# Patient Record
Sex: Male | Born: 1960 | Race: White | Hispanic: No | Marital: Married | Smoking: Never smoker
Health system: Southern US, Community
[De-identification: ages and names within clinical notes are randomized; demographics above are authoritative.]

## PROBLEM LIST (undated history)

## (undated) DIAGNOSIS — M1990 Unspecified osteoarthritis, unspecified site: Secondary | ICD-10-CM

## (undated) DIAGNOSIS — M1712 Unilateral primary osteoarthritis, left knee: Secondary | ICD-10-CM

## (undated) DIAGNOSIS — K219 Gastro-esophageal reflux disease without esophagitis: Secondary | ICD-10-CM

## (undated) DIAGNOSIS — Z87442 Personal history of urinary calculi: Secondary | ICD-10-CM

## (undated) DIAGNOSIS — M109 Gout, unspecified: Secondary | ICD-10-CM

## (undated) DIAGNOSIS — F329 Major depressive disorder, single episode, unspecified: Secondary | ICD-10-CM

## (undated) DIAGNOSIS — F32A Depression, unspecified: Secondary | ICD-10-CM

## (undated) DIAGNOSIS — M19012 Primary osteoarthritis, left shoulder: Secondary | ICD-10-CM

## (undated) HISTORY — PX: KNEE ARTHROSCOPY: SUR90

## (undated) HISTORY — PX: EYE SURGERY: SHX253

## (undated) HISTORY — PX: JOINT REPLACEMENT: SHX530

## (undated) HISTORY — PX: SHOULDER SURGERY: SHX246

## (undated) HISTORY — PX: BACK SURGERY: SHX140

## (undated) HISTORY — PX: HAND RECONSTRUCTION: SHX1730

---

## 1998-03-29 ENCOUNTER — Emergency Department (HOSPITAL_COMMUNITY): Admission: EM | Admit: 1998-03-29 | Discharge: 1998-03-29 | Payer: Self-pay | Admitting: Emergency Medicine

## 1998-04-29 ENCOUNTER — Ambulatory Visit (HOSPITAL_COMMUNITY): Admission: RE | Admit: 1998-04-29 | Discharge: 1998-04-29 | Payer: Self-pay | Admitting: Urology

## 1998-09-28 HISTORY — PX: NOSE SURGERY: SHX723

## 1999-07-15 ENCOUNTER — Encounter: Payer: Self-pay | Admitting: Emergency Medicine

## 1999-07-15 ENCOUNTER — Emergency Department (HOSPITAL_COMMUNITY): Admission: EM | Admit: 1999-07-15 | Discharge: 1999-07-15 | Payer: Self-pay | Admitting: Emergency Medicine

## 1999-08-13 ENCOUNTER — Encounter: Payer: Self-pay | Admitting: Specialist

## 1999-08-13 ENCOUNTER — Encounter: Admission: RE | Admit: 1999-08-13 | Discharge: 1999-08-13 | Payer: Self-pay | Admitting: Specialist

## 2000-10-06 ENCOUNTER — Emergency Department (HOSPITAL_COMMUNITY): Admission: EM | Admit: 2000-10-06 | Discharge: 2000-10-06 | Payer: Self-pay | Admitting: Emergency Medicine

## 2001-02-17 ENCOUNTER — Emergency Department (HOSPITAL_COMMUNITY): Admission: EM | Admit: 2001-02-17 | Discharge: 2001-02-17 | Payer: Self-pay | Admitting: Internal Medicine

## 2001-02-17 ENCOUNTER — Encounter: Payer: Self-pay | Admitting: Emergency Medicine

## 2001-05-13 ENCOUNTER — Encounter: Payer: Self-pay | Admitting: General Surgery

## 2001-05-13 ENCOUNTER — Ambulatory Visit (HOSPITAL_COMMUNITY): Admission: RE | Admit: 2001-05-13 | Discharge: 2001-05-13 | Payer: Self-pay | Admitting: General Surgery

## 2001-06-13 ENCOUNTER — Encounter (INDEPENDENT_AMBULATORY_CARE_PROVIDER_SITE_OTHER): Payer: Self-pay | Admitting: Specialist

## 2001-06-13 ENCOUNTER — Ambulatory Visit (HOSPITAL_COMMUNITY): Admission: RE | Admit: 2001-06-13 | Discharge: 2001-06-13 | Payer: Self-pay | Admitting: General Surgery

## 2001-09-10 ENCOUNTER — Emergency Department (HOSPITAL_COMMUNITY): Admission: EM | Admit: 2001-09-10 | Discharge: 2001-09-10 | Payer: Self-pay

## 2004-01-10 ENCOUNTER — Emergency Department (HOSPITAL_COMMUNITY): Admission: EM | Admit: 2004-01-10 | Discharge: 2004-01-10 | Payer: Self-pay | Admitting: Emergency Medicine

## 2006-02-25 ENCOUNTER — Emergency Department (HOSPITAL_COMMUNITY): Admission: EM | Admit: 2006-02-25 | Discharge: 2006-02-25 | Payer: Self-pay | Admitting: Emergency Medicine

## 2007-04-15 ENCOUNTER — Encounter: Admission: RE | Admit: 2007-04-15 | Discharge: 2007-04-15 | Payer: Self-pay | Admitting: Gastroenterology

## 2007-09-29 HISTORY — PX: SPINAL CORD STIMULATOR IMPLANT: SHX2422

## 2008-04-30 ENCOUNTER — Encounter
Admission: RE | Admit: 2008-04-30 | Discharge: 2008-04-30 | Payer: Self-pay | Admitting: Physical Medicine and Rehabilitation

## 2008-05-30 ENCOUNTER — Ambulatory Visit (HOSPITAL_COMMUNITY): Admission: RE | Admit: 2008-05-30 | Discharge: 2008-05-31 | Payer: Self-pay | Admitting: Specialist

## 2009-03-07 ENCOUNTER — Ambulatory Visit (HOSPITAL_COMMUNITY): Admission: RE | Admit: 2009-03-07 | Discharge: 2009-03-08 | Payer: Self-pay | Admitting: Orthopedic Surgery

## 2009-04-30 ENCOUNTER — Emergency Department (HOSPITAL_COMMUNITY): Admission: EM | Admit: 2009-04-30 | Discharge: 2009-04-30 | Payer: Self-pay | Admitting: Emergency Medicine

## 2010-06-05 ENCOUNTER — Encounter: Payer: Self-pay | Admitting: Sports Medicine

## 2010-06-13 ENCOUNTER — Encounter: Payer: Self-pay | Admitting: Sports Medicine

## 2010-06-23 ENCOUNTER — Ambulatory Visit: Payer: Self-pay | Admitting: Sports Medicine

## 2010-06-23 ENCOUNTER — Encounter: Admission: RE | Admit: 2010-06-23 | Discharge: 2010-06-23 | Payer: Self-pay | Admitting: Occupational Medicine

## 2010-06-26 ENCOUNTER — Encounter: Payer: Self-pay | Admitting: Sports Medicine

## 2010-10-28 NOTE — Letter (Signed)
Summary: [employer]   Imported By: Marily Memos 06/23/2010 11:22:14  _____________________________________________________________________  External Attachment:    Type:   Image     Comment:   External Document

## 2010-10-28 NOTE — Letter (Signed)
Summary: [facility]  [facility]I   Imported By: Marily Memos 06/26/2010 10:31:43  _____________________________________________________________________  External Attachment:    Type:   Image     Comment:   External Document

## 2010-10-28 NOTE — Consult Note (Signed)
Summary: [employer] [place] [facility] [place]   Imported By: Marily Memos 06/06/2010 08:53:55  _____________________________________________________________________  External Attachment:    Type:   Image     Comment:   External Document
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
## 2010-10-28 NOTE — Letter (Signed)
Summary: [employer], [employer]   Imported By: Marily Memos 06/13/2010 11:49:01  _____________________________________________________________________  External Attachment:    Type:   Image     Comment:   External Document

## 2010-10-28 NOTE — Letter (Signed)
Summary: *Consult Note  [facility]  [address]   [place], [place] [address]   Phone: [phone]  Fax: [phone]    Re:    Alan Kennedy DOB:    [birth year] Kathrine Haddock, MD [facility] 06/23/10   Dear Grayland Ormond:    Thank you for requesting that we see the above patient for consultation.  A copy of the detailed office note will be sent under separate cover, for your review.  Evaluation today is consistent with:  1)  MEDIAL MENISCUS TEAR, LEFT (ICD-836.0) 2)  KNEE INJURY, LEFT (ICD-959.7)   Our recommendation is for: surgical consultation as I think this injury is causing mechanical symptoms.  Still very painful at iimes as well.   New Orders include:  1)  Diagnostic X-Ray/Fluoroscopy [Diagnostic X-Ray/Flu] 2)  Consultation Level II [99242] 3)  [place] LIMITED [[id number]]    Thank you for this consultation.  If you have any further questions regarding the care of this patient, please do not hesitate to contact me @ [phone].  Thank you for this opportunity to look after your patient.  Sincerely,  Vincent Gros MD

## 2011-01-05 LAB — HEMOGLOBIN AND HEMATOCRIT, BLOOD
HCT: 42.1 % (ref 39.0–52.0)
Hemoglobin: 14.3 g/dL (ref 13.0–17.0)

## 2011-02-10 NOTE — Op Note (Signed)
NAME:  Alan Kennedy, Alan Kennedy NO.:  [id number]   MEDICAL RECORD NO.:  [id number]          PATIENT TYPE:  AMB   LOCATION:  DAY                          FACILITY[facility]  [facility]   PHYSICIAN:  Alvy Beal, MD    DATE OF BIRTH:  Feb 22, 1961   DATE OF PROCEDURE:  03/07/2009  DATE OF DISCHARGE:                               OPERATIVE REPORT   PREOPERATIVE DIAGNOSIS:  Chronic back and right lower extremity pain.   POSTOPERATIVE DIAGNOSIS:  Chronic back and right lower extremity pain.   OPERATIVE PROCEDURE:  Implantation of permanent spinal cord stimulator.   COMPLICATIONS:  None.   FIRST ASSISTANT:  Crissie Reese.   Minimal blood loss.   HISTORY:  This is a very pleasant 50 year old gentleman who in the past  had lumbar decompression diskectomy.  He continued to have significant  back and leg pain.  A trial stimulator was placed, and the patient noted  significant relief in his symptoms.  As a result, he elected to proceed  with permanent implantation.   All appropriate risks, benefits and alternatives to surgery were  discussed, and the patient consented to the aforementioned procedure.   OPERATIVE NOTE:  The patient was brought to the operating theater,  placed supine on the operating table.  After successful induction of  general anesthesia, endotracheal intubation, TED, SCDs were applied.  He  was turned prone onto a Wilson frame and the arms placed overhead.  All  bony prominences were well padded, and the back was prepped and draped  in standard fashion.  Preoperatively, in the holding area in the sitting  and standing position, I measured and marked where the right gluteal  incision would be for the battery, and the patient was satisfied with  this.  The prep and drape including areas so I could visualize both  sides.   An x-ray was then brought to the wound sterilely, and then lateral  fluoroscopy we began counting from L5 up to T9 spinous process.  Once  identified the T9 spinous process, I then planned to do my incision  centered about the T9-10 disk space.  Preoperatively, the trial was  placed and most utilized between T7 and C8-1.   A midline incision was made approximately 3.5 inches in length.  Sharp  dissection was carried out down to and through the deep fascia.  Using a  Cobb elevator and electrocautery, I mobilized the paraspinal muscles to  expose the spinous process and lamina of T9-T10.   I then marked the T9-T10 interspinous process space and then counted  again from L5 using lateral fluoroscopy.  I used 18-gauge spinal needles  to mark the levels as I counted up.  Again, once I identified that I was  at the appropriate T9 lamina, I began my decompression.   Using a double-action Leksell rongeur, I removed the inferior portion of  the T9 spinous process.  I then used a micro curette to develop a plane  underneath the lamina and then used the 2- and 3-mm Kerrison to perform  a generous laminotomy.  Once I had the laminotomy complete,  I then used  a Penfield 4 to develop a plane underneath the ligamentum flavum.  I  used a 2-mm Kerrison to resect this ligamentum flavum and exposed the  underlying thecal sac.  Once I had the wide laminotomy, I could clearly  see the thecal sac.  I then used a dural elevator trial the area to  ensure that I would be able to freely pass the implant.  Once I was able  to do this freely without tension on the thecal sac without significant  compression or attention to the thecal sac, I then obtained the standard  10-mm wide St. Jude dorsal column stimulator and advanced it just to the  inferior aspect of the T7 superior endplate.  This allowed the implant  to span from T7 to T8.  I checked the AP films, and the implant was  slightly over to the right which was the symptomatic side.  I then again  counted from L5 up to ensure that my implant was properly positioned.  Once I had confirmed this, I  then began suture down to prevent  migration.  I placed 2 bone holes into the spinous process of T10 and  then used a #2 fiber wire to secure the leads to the bone to the T10  spinous process.  I then made a cut in the T10-11 interspinous process  ligament to allow me to wrap the leads were around this.  I then sutured  the ligament back together to hold the leads in place.   At this point in time, I then made another incision over the right  gluteal region and sharply dissected down 2.5 cm.  I then bluntly made a  pocket for the battery.  Once this was completed, I used the submuscular  passer to pass from the thoracic wound to about the midportion of the  thoracic back, and I punched out through a hole.  Because of the  distance, I could not do it in one pass.  I then passed the wires  through the leads and out the temporary hole near the thoracolumbar  junction and then placed another hole from the stab incision into the  right gluteal region where the battery will be located.  Once the wires  were all passed and tucked submuscular, I then connected it to the  battery and ran a test run.  The leads were functioning properly without  any problems.  At this point, having passed the leads all the way down,  I secured into the battery and then took final AP and lateral x-rays,  again confirming that the leads had not migrated during the positioning  portion of the surgery.   At this point, I then placed the battery in the pocket and tied it down  to the deep fascia with interrupted #1 Vicryl suture.  All wounds were  copiously irrigated, and thrombin-soaked Gelfoam was placed over the  laminotomy site.  I then closed the deep fascia of both wounds with  interrupted #1 Vicryl sutures, superficial 2-0 Vicryl sutures and 3-0  Monocryl for the skin.  Steri-Strips and dry dressing were applied, and  the patient was extubated and transferred to PACU without incident.  At  the end of the case,  all needle and sponge counts were correct.  There  were no adverse intraoperative complications.      Alvy Beal, MD  Electronically Signed     DDB/MEDQ  D:  03/07/2009  T:  03/08/2009  Job:  [id number]

## 2011-02-10 NOTE — Op Note (Signed)
NAMENGUYEN, TODOROV NO.:  [id number]   MEDICAL RECORD NO.:  [id number]          PATIENT TYPE:  OIB   LOCATION:  1524                         FACILITY:  [facility]   PHYSICIAN:  Jene Every, M.D.    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   PROCEDURE:  Lateral recess decompression, microdiskectomy 4-5 right,  foraminotomy of L5.   ANESTHESIA:  General.   SURGEON:  Jene Every, M.D.   ASSISTANT:  Roma Schanz, P.A.   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  He had a  positive sign, EHL weakness, numbness in the L5 nerve root distribution.  He was indicated for decompression.  Risks and benefits discussed  including bleeding, infection, damage to neurovascular structures, CSF  leakage, epidural fibrosis, adjacent segment disease, need for fusion in  the future, anesthetic complications, etc.   PROCEDURE IN DETAIL:  With the patient in supine position after  induction of adequate general anesthesia and 2 grams of Kefzol he was  placed prone on the [place] frame.  All bony prominences well-padded and  the lumbar area was prepped and draped in the usual sterile fashion.  Two 18 gauge spinal needles utilized to localize the 4-5 interspace  confirmed with x-ray.  Incision was made from the spinous process to 4-5  and subcutaneous tissue was dissected and electrocautery used to achieve  hemostasis.  The McCullough retractor was placed.  Operating microscope  draped and brought into the surgical field after x-ray confirmed the  4-5  space.  Hemilaminotomy of the caudad edge of 4 was performed with 2 mm  Kerrison.  A straight curette was utilized to detach the ligamentum  flavum from the cephalad edge of 5.  Neural patty placed beneath the  ligamentum flavum, hypertrophic ligamentum flavum and lateral recess  stenosis was noted significantly into the lateral recess multifactorial.  Ligamentum flavum then removed from the interspace.  The thecal sac was  gently mobilized medially as was the L5 nerve root with small focal HNP  with an epidural venous plexus.  Ligamentum flavum of the 5 root this  decompressed the medial border of the pedicle.  Bipolar cautery was  utilized to achieve hemostasis.  Lysed the epidural venous plexus and  mobilized it medially, performed an annulotomy and copious portion of  distal tear was removed with straight and upbiting pituitary.  After  diskectomy of all herniated material there was at least 1 cm of  excursion on the 5 root medial to the pedicle.  We examined the foramen  of 4, 5 beneath thecal sac, axilla and the shoulder of the root.  There  was no evidence of a nerve compressive lesion.  Disk  space was copiously  irrigated with antibiotic irrigation.  Final confirmatory radiograph was  obtained just prior to the diskectomy.  Next, the [facility] retractor  was removed and irrigated.  Paraspinous muscles inspected and there was  no evidence of active bleeding.  Dorsolumbar fascia reapproximated with  0 Vicryl interrupted figure-of-eight sutures, subcu with 2-0 Vicryl  simple sutures.  Skin  was reapproximated with 4-0 subcuticular Prolene.  Wound reinforced with  Steri-Strips.  Sterile dressing applied.  The patient was placed supine  on the hospital bed, extubated without difficulty and transferred to  recovery room in satisfactory condition.  The patient tolerated the  procedure without any complications.      Jene Every, M.D.  Electronically Signed     JB/MEDQ   D:  05/30/2008  T:  05/30/2008  Job:  [id number]

## 2011-02-13 NOTE — Discharge Summary (Signed)
Alan Kennedy, Alan Kennedy NO.:  [id number]   MEDICAL RECORD NO.:  [id number]          PATIENT TYPE:  OIB   LOCATION:  1615                         FACILITY:  [facility]   PHYSICIAN:  Alvy Beal, MD    DATE OF BIRTH:  [birth year]   DATE OF ADMISSION:  03/07/2009  DATE OF DISCHARGE:  03/08/2009                               DISCHARGE SUMMARY   ADMISSION DIAGNOSIS:  Chronic back and right lower extremity pain.   DISCHARGE DIAGNOSIS:  Chronic back and right lower extremity pain.   OPERATIVE PROCEDURE:  Implantation of a permanent spinal cord  stimulator.   BRIEF HISTORY:  Alan Kennedy is a very pleasant 50 year old gentleman  who in the past has had a lumbar decompression and a diskectomy.  He has  continued to have significant back and leg pain . A trial spinal cord  stimulator was placed and the patient noted significant relief of  symptoms.  As a result, he elected to proceed with his permanent  implantation.  All appropriate risks, benefits, and alternatives to  surgery were discussed and the patient consented to the procedure.   HOSPITAL COURSE:  Patient's hospital course was approximately 1 day in  length.  He tolerated his procedure very well and was transferred from  the PACU to the orthopedic floor without incident.  Postoperatively day  #1, patient had no shortness of breath and/or chest pain.  His spinal  cord stimulator was turned on and it was giving him good coverage.  His  pain was well controlled.  He was tolerating a regular diet.  He had no  shortness of breath and/or chest pain.  He worked well with physical  therapy therefore he was deemed stable to be discharged home.   DISPOSITION:  To home.   DISCHARGE CONDITION:  Stable.   DISCHARGE MEDICATIONS:  Include:  1. Fish oil.  2. Multivitamin.  3. He also being discharged home on Percocet 5/325 one tab p.o. every      4 to 6 hours p.r.n. pain.  4. Robaxin 500 mg 1 tablet p.o. q.8 p.r.n. muscle  spasms.   He is to discontinue the use of his home hydrocodone.   DISCHARGE INSTRUCTIONS:  He is to follow up with Dr. Shon Baton in 2 weeks  for a wound check for removal of sutures.  He is allowed to shower on  postoperatively day #5.  He is still to have back precautions.  No  bending, stooping, twisting, and/or squatting.  No lifting for 6 weeks.  No driving for 6 weeks.  No sexual activity for 6  weeks.  He is to change his dressing daily for 7 days.  He is to call  our office at [phone] to set up his followup appointment.  He is also  to call our office at that number if he experiences any fevers, chills,  weakness, nausea or vomiting, pain or redness around his incision site.      Crissie Reese, PA      Alvy Beal, MD  Electronically Signed    AC/MEDQ  D:  04/14/2009  T:  04/14/2009  Job:  [id number]

## 2011-02-13 NOTE — Op Note (Signed)
[facility]  Patient:    Alan Kennedy, Alan Kennedy Visit Number: [id number] MRN: [id number]          Service Type: DSU Location: DAY Attending Physician:  Carson Myrtle Dictated by:   Sheppard Plumber Earlene Plater, M.D. Proc. Date: 06/13/01 Admit Date:  06/13/2001                             Operative Report  PREOPERATIVE DIAGNOSIS:  Diarrhea, internal and external hemorrhoids.  POSTOPERATIVE DIAGNOSIS:  Diarrhea, internal and external hemorrhoids.  OPERATION PERFORMED:  Flexible sigmoidoscopy and hemorrhoidectomy.  SURGEON:  Timothy E. Earlene Plater, M.D.  ANESTHESIA:  General.  INDICATIONS FOR PROCEDURE:  [name] has been seen and treated in the office with conservative management.  It helped some but it did not completely fix the problem and after careful consideration and consultation, he wishes to proceed with surgery.  He has a long history of diarrhea and though a barium enema is negative, we felt that a flexible sigmoidoscopy was mandatory to rule out inflammatory bowel disease.  He was prepped at home and has agreed to the procedure.  DESCRIPTION OF PROCEDURE:  The patient was brought to the operating room and placed supine.  LMA anesthesia provided.  He was placed in lithotomy, perianal area inspected.  Hemorrhoids were present in the left lateral and right posterior positions.  The flexible sigmoidoscope was introduced and advanced carefully under direct vision to a full 65 cm and though there was some stool present, this was washed, irrigated and suctioned away and the mucosa, contour and general anatomy of the entire lower colon was within normal limits.  The scope was withdrawn and removed. The area prepped and draped in the usual fashion.  Marcaine 0.25% with epinephrine mixed 9:1 with Wydase was injected around and about the anal orifice and massaged in well.  The right posterior and the left lateral hemorrhoidal complexes were removed.  Each  has an ellipse undermining of the edges removing superficial varicosities.  The right posterior wound closed with a running 2-0 chromic.  The left lateral wound closed with a 3-0 chromic.  The end results were satisfactory.  There was no other disease or pathology.  Specimens to the lab.  No bleeding or complication.  Gelfoam gauze and a dry sterile dressing were applied.  The patient tolerated the procedure well and was removed to the recovery room in good condition.  Written and verbal instructions were carefully given to him and his wife including prescription for Phenergan for nausea and Percocet 5 mg #40 for pain.  He will be followed as an outpatient. Dictated by:   Sheppard Plumber Earlene Plater, M.D. Attending Physician:  Carson Myrtle DD:  06/13/01 TD:  06/13/01 Job: [id number]

## 2011-07-01 LAB — COMPREHENSIVE METABOLIC PANEL
ALT: 27
Albumin: 3.8
Alkaline Phosphatase: 53
BUN: 6
Chloride: 102
Glucose, Bld: 101 — ABNORMAL HIGH
Potassium: 4.1
Sodium: 138
Total Bilirubin: 0.8

## 2011-07-01 LAB — PROTIME-INR
INR: 1
Prothrombin Time: 13.2

## 2011-07-01 LAB — URINALYSIS, ROUTINE W REFLEX MICROSCOPIC
Bilirubin Urine: NEGATIVE
Glucose, UA: NEGATIVE
Hgb urine dipstick: NEGATIVE
Specific Gravity, Urine: 1.016
Urobilinogen, UA: 0.2

## 2011-07-01 LAB — CBC
HCT: 41.7
Hemoglobin: 14.1
RBC: 4.43
WBC: 8.6

## 2011-08-30 ENCOUNTER — Encounter: Payer: Self-pay | Admitting: *Deleted

## 2011-08-30 ENCOUNTER — Emergency Department (HOSPITAL_COMMUNITY)
Admission: EM | Admit: 2011-08-30 | Discharge: 2011-08-30 | Disposition: A | Discharge status: Home / Self Care | Payer: 59 | Attending: Emergency Medicine | Admitting: Emergency Medicine

## 2011-08-30 DIAGNOSIS — W540XXA Bitten by dog, initial encounter: Secondary | ICD-10-CM | POA: Insufficient documentation

## 2011-08-30 DIAGNOSIS — S61459A Open bite of unspecified hand, initial encounter: Secondary | ICD-10-CM

## 2011-08-30 DIAGNOSIS — S61409A Unspecified open wound of unspecified hand, initial encounter: Secondary | ICD-10-CM | POA: Insufficient documentation

## 2011-08-30 MED ORDER — TETANUS-DIPHTHERIA TOXOIDS TD 5-2 LFU IM INJ: 0.5000 mL | INJECTION | Freq: Once | INTRAMUSCULAR | Status: DC

## 2011-08-30 MED ORDER — AMOXICILLIN-POT CLAVULANATE 875-125 MG PO TABS
1.0000 | ORAL_TABLET | Freq: Once | ORAL | Status: AC
  Administered 2011-08-30: 1 via ORAL
  Filled 2011-08-30: qty 1

## 2011-08-30 MED ORDER — TETANUS-DIPHTHERIA TOXOIDS TD 5-2 LFU IM INJ
0.5000 mL | INJECTION | Freq: Once | INTRAMUSCULAR | Status: AC
  Administered 2011-08-30: 0.5 mL via INTRAMUSCULAR
  Filled 2011-08-30: qty 0.5

## 2011-08-30 MED ORDER — IBUPROFEN 800 MG PO TABS
800.0000 mg | ORAL_TABLET | Freq: Once | ORAL | Status: AC
  Administered 2011-08-30: 800 mg via ORAL
  Filled 2011-08-30: qty 1

## 2011-08-30 MED ORDER — ACETAMINOPHEN 500 MG PO TABS
1000.0000 mg | ORAL_TABLET | Freq: Once | ORAL | Status: AC
  Administered 2011-08-30: 1000 mg via ORAL
  Filled 2011-08-30: qty 2

## 2011-08-30 MED ORDER — AMOXICILLIN-POT CLAVULANATE 875-125 MG PO TABS: 1.0000 | ORAL_TABLET | Freq: Two times a day (BID) | ORAL | Status: AC

## 2011-08-30 NOTE — ED Notes (Signed)
Dog bite left hand from his two k-9 dogs. Multiple puncture wounds.

## 2011-08-30 NOTE — ED Notes (Signed)
Soaking hand in betadine. 

## 2011-08-30 NOTE — ED Provider Notes (Signed)
History     CSN: [id number] Arrival date & time: 08/30/2011  3:24 PM   First MD Initiated Contact with Patient 08/30/11 1617      Chief Complaint  Patient presents with  . 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 accidental dog bite to left hand from his personal police dog a brief time ago at home. No other injuries. Range of motion makes pain worse. Pain is sharp.  History reviewed. No pertinent past medical history.  Past Surgical History  Procedure Date  . Back surgery   . Knee arthroscopy     30% cartiledge removal  . Hand reconstruction   . Shoulder surgery     No family history on file.  History  Substance Use Topics  . Smoking status: Not on file  . Smokeless tobacco: Not on file  . Alcohol Use:       Review of Systems  All other systems reviewed and are negative.    Allergies  Review of patient's allergies indicates no known allergies.  Home Medications   Current Outpatient Rx  Name Route Sig Dispense Refill  . AMOXICILLIN-POT CLAVULANATE 875-125 MG PO TABS Oral Take 1 tablet by mouth 2 (two) times daily. One po bid x 7 days 20 tablet 0    BP 150/81  Pulse 93  Temp(Src) 98.7 F (37.1 C) (Oral)  Resp 16  SpO2 100%  Physical Exam  Nursing note and vitals reviewed. Constitutional: He is oriented to person, place, and time. He appears well-developed and well-nourished.  HENT:  Head: Normocephalic.  Musculoskeletal:       Left hand examined: On the fourth digit there is a 1 cm superficial laceration on the proximal phalanx medial aspect; on the third digit there is a 1.0 cm laceration on the PIP joint laterally. Full range of motion of entire hand.  Neurological: He is alert and oriented to person, place, and time.  Skin: Skin is warm and dry.  Psychiatric: He has a normal mood and affect.    ED Course  Procedures (including critical  care time)  Labs Reviewed - No data to display No results found.   1. Dog bite, hand       MDM  Will update tetanus, start Augmentin, discussed case with patient and wife. He understands possibility of infection. Encouraged him to followup here or with hand surgeon for any evidence of infection        Donnetta Hutching, MD 08/30/11 1725

## 2012-02-22 ENCOUNTER — Encounter (HOSPITAL_COMMUNITY): Payer: Self-pay | Admitting: Emergency Medicine

## 2012-02-22 ENCOUNTER — Emergency Department (HOSPITAL_COMMUNITY): Payer: 59

## 2012-02-22 ENCOUNTER — Emergency Department (HOSPITAL_COMMUNITY)
Admission: EM | Admit: 2012-02-22 | Discharge: 2012-02-22 | Disposition: A | Discharge status: Home / Self Care | Payer: 59 | Attending: Emergency Medicine | Admitting: Emergency Medicine

## 2012-02-22 DIAGNOSIS — W230XXA Caught, crushed, jammed, or pinched between moving objects, initial encounter: Secondary | ICD-10-CM | POA: Insufficient documentation

## 2012-02-22 DIAGNOSIS — S6980XA Other specified injuries of unspecified wrist, hand and finger(s), initial encounter: Secondary | ICD-10-CM | POA: Insufficient documentation

## 2012-02-22 DIAGNOSIS — T148XXA Other injury of unspecified body region, initial encounter: Secondary | ICD-10-CM

## 2012-02-22 DIAGNOSIS — S6990XA Unspecified injury of unspecified wrist, hand and finger(s), initial encounter: Secondary | ICD-10-CM

## 2012-02-22 DIAGNOSIS — S61209A Unspecified open wound of unspecified finger without damage to nail, initial encounter: Secondary | ICD-10-CM | POA: Insufficient documentation
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
## 2012-02-22 NOTE — Discharge Instructions (Signed)

## 2012-02-22 NOTE — ED Provider Notes (Addendum)
History   This chart was scribed for Gwyneth Sprout, MD by Brooks Sailors. The patient was seen in room STRE7/STRE7. Patient's care was started at 1304.   CSN: [id number]  Arrival date & time 02/22/12  1304   First MD Initiated Contact with Patient 02/22/12 1322      Chief Complaint  Patient presents with  . Finger Injury    (Consider location/radiation/quality/duration/timing/severity/associated sxs/prior treatment) HPI  Alan Kennedy is a 51 y.o. male who presents to the Emergency Department complaining of a left fourth finger laceration after getting finger stuck in a pulley this morning. Pt says the pain does not radiate. Pt says his TD was one year ago. Bleeding has currently stopped.    History reviewed. No pertinent past medical history.  Past Surgical History  Procedure Date  . Back surgery   . Knee arthroscopy     30% cartiledge removal  . Hand reconstruction   . Shoulder surgery     History reviewed. No pertinent family history.  History  Substance Use Topics  . Smoking status: Not on file  . Smokeless tobacco: Not on file  . Alcohol Use:       Review of Systems  All other systems reviewed and are negative.    Allergies  Review of patient's allergies indicates no known allergies.  Home Medications   Current Outpatient Rx  Name Route Sig Dispense Refill  . DULOXETINE HCL 20 MG PO CPEP Oral Take 20 mg by mouth every other day.    . MULTI-VITAMIN/MINERALS PO TABS Oral Take 1 tablet by mouth daily.    [name] Kitchen PREDNISONE 5 MG PO TABS Oral Take 20 mg by mouth daily. Tapering dose. Currently on 4 tablets daily      BP 134/63  Pulse 90  Temp(Src) 98.8 F (37.1 C) (Oral)  Resp 20  Wt 205 lb (92.987 kg)  SpO2 98%  Physical Exam  Nursing note and vitals reviewed. Constitutional: He is oriented to person, place, and time. He appears well-developed and well-nourished. No distress.  HENT:  Head: Normocephalic and atraumatic.  Eyes: EOM are normal.  Pupils are equal, round, and reactive to light.  Neck: Neck supple. No tracheal deviation present.  Cardiovascular: Normal rate.   Pulmonary/Chest: Effort normal. No respiratory distress.  Abdominal: Soft.  Musculoskeletal: Normal range of motion.       Hands:      Left fourth finger skin tear laceration along the right side of the nail. Good cap refill and sensation normal, tendon function intact.  Neurological: He is alert and oriented to person, place, and time.  Skin: Skin is warm and dry.  Psychiatric: He has a normal mood and affect. His behavior is normal.    ED Course  Procedures (including critical care time)  Pt seen at 1339. To have x-ray of the left hand and will soak finger in fluids.   Labs Reviewed - No data to display Dg Finger Ring Left  02/22/2012  *RADIOLOGY REPORT*  Clinical Data: Left ring finger injury, pain/deformity  LEFT RING FINGER 2+V  Comparison: None.  Findings: No fracture or dislocation is seen.  The joint spaces are preserved.  Suspected soft tissue injury/laceration along the radial aspect of the distal second digit.  No radiopaque foreign body is seen.  IMPRESSION: No fracture, dislocation, or radiopaque foreign body is seen.  Suspected soft tissue injury/laceration along the radial aspect of the distal second digit.  Original Report Authenticated By: Charline Bills, M.D.  1. Finger injury   2. Skin avulsion       MDM   Patient with an injury to his finger today when it got smashed in a pulley.  After the wound soaked in it was debrided there was no deep laceration in the skin avulsed. The excess skin was removed and the wound was cleaned and dressed. Plain films were within normal limits. Shot is up-to-date      I personally performed the services described in this documentation, which was scribed in my presence.  The recorded information has been reviewed and considered.      Gwyneth Sprout, MD 02/22/12 1506  Gwyneth Sprout,  MD 02/22/12 1507

## 2013-11-02 ENCOUNTER — Encounter (HOSPITAL_COMMUNITY): Payer: Self-pay | Admitting: Pharmacy Technician

## 2013-11-03 ENCOUNTER — Other Ambulatory Visit: Payer: Self-pay | Admitting: Orthopedic Surgery

## 2013-11-04 NOTE — Pre-Procedure Instructions (Addendum)
JAYLAND NULL  11/04/2013   Your procedure is scheduled on:  February 17  Report to [facility] Entrance "A" [address] at 06:00 AM.  Call this number if you have problems the morning of surgery: [phone]   Remember:   Do not eat food or drink liquids after midnight.   Take these medicines the morning of surgery with A SIP OF WATER: Cymbalta   STOP Multiple Vitamins February 11   STOP/ Do not take Aspirin, Aleve, Naproxen, Advil, Ibuprofen, Vitamin, Herbs, or Supplements starting February 11   Do not wear jewelry.  Do not wear lotions, powders, or colgne. You may wear deodorant.  You may shave face and neck.  Do not bring valuables to the hospital.  [facility] is not responsible                  for any belongings or valuables.               Contacts, dentures or bridgework may not be worn into surgery.  Leave suitcase in the car. After surgery it may be brought to your room.  For patients admitted to the hospital, discharge time is determined by your                treatment team.               Special Instructions: Use CHG the night before surgery and the morning as directed below.   Please read over the following fact sheets that you were given: Pain Booklet, Coughing and Deep Breathing, Blood Transfusion Information, Total Joint Packet and Surgical Site Infection Prevention  [facility] - Preparing for Surgery  Before surgery, you can play an important role.  Because skin is not sterile, your skin needs to be as free of germs as possible.  You can reduce the number of germs on you skin by washing with CHG (chlorahexidine gluconate) soap before surgery.  CHG is an antiseptic cleaner which kills germs and bonds with the skin to continue killing germs even after washing.  Please DO NOT use if you have an allergy to CHG or antibacterial soaps.  If your skin becomes reddened/irritated stop using the CHG and inform your nurse when you arrive at  Short Stay.  Do not shave (including legs and underarms) for at least 48 hours prior to the first CHG shower.  You may shave your face.  Please follow these instructions carefully:   1.  Shower with CHG Soap the night before surgery and the                                morning of Surgery.  2.  If you choose to wash your hair, wash your hair first as usual with your       normal shampoo.  3.  After you shampoo, rinse your hair and body thoroughly to remove the                      Shampoo.  4.  Use CHG as you would any other liquid soap.  You can apply chg directly       to the skin and wash gently with scrungie or a clean washcloth.  5.  Apply the CHG Soap to your body ONLY FROM THE NECK DOWN.        Do not use  on open wounds or open sores.  Avoid contact with your eyes,       ears, mouth and genitals (private parts).  Wash genitals (private parts)       with your normal soap.  6.  Wash thoroughly, paying special attention to the area where your surgery        will be performed.  7.  Thoroughly rinse your body with warm water from the neck down.  8.  DO NOT shower/wash with your normal soap after using and rinsing off       the CHG Soap.  9.  Pat yourself dry with a clean towel.            10.  Wear clean pajamas.            11.  Place clean sheets on your bed the night of your first shower and do not        sleep with pets.  Day of Surgery  Do not apply any lotions the morning of surgery.  Please wear clean clothes to the hospital/surgery center.

## 2013-11-06 ENCOUNTER — Encounter (HOSPITAL_COMMUNITY)
Admission: RE | Admit: 2013-11-06 | Discharge: 2013-11-06 | Disposition: A | Discharge status: Home / Self Care | Payer: Worker's Compensation | Source: Ambulatory Visit | Attending: Orthopedic Surgery | Admitting: Orthopedic Surgery

## 2013-11-06 ENCOUNTER — Encounter (HOSPITAL_COMMUNITY): Payer: Self-pay

## 2013-11-06 DIAGNOSIS — Z01812 Encounter for preprocedural laboratory examination: Secondary | ICD-10-CM | POA: Insufficient documentation

## 2013-11-06 HISTORY — DX: Unspecified osteoarthritis, unspecified site: M19.90

## 2013-11-06 HISTORY — DX: Depression, unspecified: F32.A

## 2013-11-06 HISTORY — DX: Major depressive disorder, single episode, unspecified: F32.9

## 2013-11-06 LAB — SURGICAL PCR SCREEN
MRSA, PCR: NEGATIVE
Staphylococcus aureus: NEGATIVE

## 2013-11-06 LAB — BASIC METABOLIC PANEL
BUN: 8 mg/dL (ref 6–23)
CHLORIDE: 102 meq/L (ref 96–112)
CO2: 27 mEq/L (ref 19–32)
Calcium: 9.6 mg/dL (ref 8.4–10.5)
Creatinine, Ser: 1.21 mg/dL (ref 0.50–1.35)
GFR, EST AFRICAN AMERICAN: 78 mL/min — AB (ref >90)
GFR, EST NON AFRICAN AMERICAN: 67 mL/min — AB (ref >90)
Glucose, Bld: 86 mg/dL (ref 70–99)
POTASSIUM: 5.2 meq/L (ref 3.7–5.3)
SODIUM: 142 meq/L (ref 137–147)

## 2013-11-06 LAB — ABO/RH: ABO/RH(D): A POS

## 2013-11-06 LAB — CBC
HEMATOCRIT: 47 % (ref 39.0–52.0)
HEMOGLOBIN: 16.7 g/dL (ref 13.0–17.0)
MCH: 31.3 pg (ref 26.0–34.0)
MCHC: 35.5 g/dL (ref 30.0–36.0)
MCV: 88.2 fL (ref 78.0–100.0)
Platelets: 329 10*3/uL (ref 150–400)
RBC: 5.33 MIL/uL (ref 4.22–5.81)
RDW: 13.2 % (ref 11.5–15.5)
WBC: 13.5 10*3/uL — AB (ref 4.0–10.5)

## 2013-11-06 LAB — TYPE AND SCREEN
ABO/RH(D): A POS
ANTIBODY SCREEN: NEGATIVE

## 2013-11-06 LAB — URINALYSIS, ROUTINE W REFLEX MICROSCOPIC
Bilirubin Urine: NEGATIVE
GLUCOSE, UA: NEGATIVE mg/dL
HGB URINE DIPSTICK: NEGATIVE
Ketones, ur: NEGATIVE mg/dL
LEUKOCYTES UA: NEGATIVE
Nitrite: NEGATIVE
PH: 6.5 (ref 5.0–8.0)
Protein, ur: NEGATIVE mg/dL
SPECIFIC GRAVITY, URINE: 1.029 (ref 1.005–1.030)
Urobilinogen, UA: 0.2 mg/dL (ref 0.0–1.0)

## 2013-11-13 MED ORDER — CEFAZOLIN SODIUM-DEXTROSE 2-3 GM-% IV SOLR
2.0000 g | INTRAVENOUS | Status: AC
  Administered 2013-11-14: 2 g via INTRAVENOUS
  Filled 2013-11-13: qty 50

## 2013-11-14 ENCOUNTER — Inpatient Hospital Stay (HOSPITAL_COMMUNITY)
Admission: RE | Admit: 2013-11-14 | Discharge: 2013-11-15 | DRG: 470 | Disposition: A | Discharge status: Home Health | Payer: Worker's Compensation | Source: Ambulatory Visit | Attending: Orthopedic Surgery | Admitting: Orthopedic Surgery

## 2013-11-14 ENCOUNTER — Inpatient Hospital Stay (HOSPITAL_COMMUNITY): Payer: Worker's Compensation

## 2013-11-14 ENCOUNTER — Encounter (HOSPITAL_COMMUNITY)
Admission: RE | Disposition: A | Discharge status: Home Health | Payer: Self-pay | Source: Ambulatory Visit | Attending: Orthopedic Surgery

## 2013-11-14 ENCOUNTER — Encounter (HOSPITAL_COMMUNITY): Payer: Self-pay

## 2013-11-14 ENCOUNTER — Inpatient Hospital Stay (HOSPITAL_COMMUNITY): Payer: Worker's Compensation | Admitting: Anesthesiology

## 2013-11-14 ENCOUNTER — Encounter (HOSPITAL_COMMUNITY): Payer: Worker's Compensation | Admitting: Anesthesiology

## 2013-11-14 DIAGNOSIS — M1712 Unilateral primary osteoarthritis, left knee: Secondary | ICD-10-CM

## 2013-11-14 DIAGNOSIS — M1710 Unilateral primary osteoarthritis, unspecified knee: Principal | ICD-10-CM | POA: Diagnosis present

## 2013-11-14 DIAGNOSIS — F329 Major depressive disorder, single episode, unspecified: Secondary | ICD-10-CM | POA: Diagnosis present

## 2013-11-14 DIAGNOSIS — F3289 Other specified depressive episodes: Secondary | ICD-10-CM | POA: Diagnosis present

## 2013-11-14 HISTORY — DX: Unilateral primary osteoarthritis, left knee: M17.12

## 2013-11-14 HISTORY — DX: Gout, unspecified: M10.9

## 2013-11-14 HISTORY — PX: PARTIAL KNEE ARTHROPLASTY: SHX2174

## 2013-11-14 HISTORY — PX: KNEE SURGERY: SHX244

## 2013-11-14 LAB — CREATININE, SERUM
Creatinine, Ser: 1.39 mg/dL — ABNORMAL HIGH (ref 0.50–1.35)
GFR, EST AFRICAN AMERICAN: 66 mL/min — AB (ref >90)
GFR, EST NON AFRICAN AMERICAN: 57 mL/min — AB (ref >90)

## 2013-11-14 LAB — CBC
HEMATOCRIT: 43 % (ref 39.0–52.0)
Hemoglobin: 14.6 g/dL (ref 13.0–17.0)
MCH: 30.4 pg (ref 26.0–34.0)
MCHC: 34 g/dL (ref 30.0–36.0)
MCV: 89.4 fL (ref 78.0–100.0)
PLATELETS: 367 10*3/uL (ref 150–400)
RBC: 4.81 MIL/uL (ref 4.22–5.81)
RDW: 13.1 % (ref 11.5–15.5)
WBC: 15.5 10*3/uL — AB (ref 4.0–10.5)

## 2013-11-14 SURGERY — ARTHROPLASTY, KNEE, UNICOMPARTMENTAL
Anesthesia: General | Site: Knee | Laterality: Left

## 2013-11-14 MED ORDER — MAGNESIUM CITRATE PO SOLN: 1.0000 | Freq: Once | ORAL | Status: AC | PRN

## 2013-11-14 MED ORDER — MENTHOL 3 MG MT LOZG: 1.0000 | LOZENGE | OROMUCOSAL | Status: DC | PRN

## 2013-11-14 MED ORDER — ACETAMINOPHEN 650 MG RE SUPP: 650.0000 mg | Freq: Four times a day (QID) | RECTAL | Status: DC | PRN

## 2013-11-14 MED ORDER — DOCUSATE SODIUM 100 MG PO CAPS
100.0000 mg | ORAL_CAPSULE | Freq: Two times a day (BID) | ORAL | Status: DC
  Administered 2013-11-14 – 2013-11-15 (×2): 100 mg via ORAL
  Filled 2013-11-14 (×2): qty 1

## 2013-11-14 MED ORDER — HYDROMORPHONE HCL PF 1 MG/ML IJ SOLN
0.2500 mg | INTRAMUSCULAR | Status: DC | PRN
  Administered 2013-11-14 (×4): 0.5 mg via INTRAVENOUS

## 2013-11-14 MED ORDER — ONDANSETRON HCL 4 MG/2ML IJ SOLN
INTRAMUSCULAR | Status: DC | PRN
  Administered 2013-11-14 (×2): 4 mg via INTRAVENOUS

## 2013-11-14 MED ORDER — OXYCODONE HCL 5 MG PO TABS
5.0000 mg | ORAL_TABLET | ORAL | Status: DC | PRN
  Administered 2013-11-14 – 2013-11-15 (×7): 10 mg via ORAL
  Filled 2013-11-14 (×7): qty 2

## 2013-11-14 MED ORDER — HYDROMORPHONE HCL 2 MG PO TABS: 2.0000 mg | ORAL_TABLET | ORAL | Status: DC | PRN

## 2013-11-14 MED ORDER — ONDANSETRON HCL 4 MG PO TABS: 4.0000 mg | ORAL_TABLET | Freq: Four times a day (QID) | ORAL | Status: DC | PRN

## 2013-11-14 MED ORDER — PHENOL 1.4 % MT LIQD: 1.0000 | OROMUCOSAL | Status: DC | PRN

## 2013-11-14 MED ORDER — SODIUM CHLORIDE 0.9 % IR SOLN
Status: DC | PRN
  Administered 2013-11-14: 1000 mL

## 2013-11-14 MED ORDER — METOCLOPRAMIDE HCL 10 MG PO TABS: 5.0000 mg | ORAL_TABLET | Freq: Three times a day (TID) | ORAL | Status: DC | PRN

## 2013-11-14 MED ORDER — EPHEDRINE SULFATE 50 MG/ML IJ SOLN
INTRAMUSCULAR | Status: AC
  Filled 2013-11-14: qty 1

## 2013-11-14 MED ORDER — ENOXAPARIN SODIUM 30 MG/0.3ML ~~LOC~~ SOLN: 30.0000 mg | Freq: Two times a day (BID) | SUBCUTANEOUS | Status: DC

## 2013-11-14 MED ORDER — MIDAZOLAM HCL 2 MG/2ML IJ SOLN
INTRAMUSCULAR | Status: AC
  Filled 2013-11-14: qty 2

## 2013-11-14 MED ORDER — PROPOFOL 10 MG/ML IV BOLUS
INTRAVENOUS | Status: AC
  Filled 2013-11-14: qty 20

## 2013-11-14 MED ORDER — DIPHENHYDRAMINE HCL 12.5 MG/5ML PO ELIX: 12.5000 mg | ORAL_SOLUTION | ORAL | Status: DC | PRN

## 2013-11-14 MED ORDER — ONDANSETRON HCL 4 MG/2ML IJ SOLN
INTRAMUSCULAR | Status: AC
  Filled 2013-11-14: qty 2

## 2013-11-14 MED ORDER — POLYETHYLENE GLYCOL 3350 17 G PO PACK: 17.0000 g | PACK | Freq: Every day | ORAL | Status: DC | PRN

## 2013-11-14 MED ORDER — PROMETHAZINE HCL 25 MG PO TABS: 25.0000 mg | ORAL_TABLET | Freq: Four times a day (QID) | ORAL | Status: DC | PRN

## 2013-11-14 MED ORDER — OXYCODONE HCL 5 MG PO TABS
ORAL_TABLET | ORAL | Status: AC
  Filled 2013-11-14: qty 1

## 2013-11-14 MED ORDER — METHOCARBAMOL 100 MG/ML IJ SOLN
500.0000 mg | Freq: Four times a day (QID) | INTRAVENOUS | Status: DC | PRN
  Filled 2013-11-14: qty 5

## 2013-11-14 MED ORDER — BISACODYL 10 MG RE SUPP
10.0000 mg | Freq: Every day | RECTAL | Status: DC | PRN
Start: 2013-11-14 — End: 2013-11-15

## 2013-11-14 MED ORDER — ONDANSETRON HCL 4 MG/2ML IJ SOLN: 4.0000 mg | Freq: Four times a day (QID) | INTRAMUSCULAR | Status: DC | PRN

## 2013-11-14 MED ORDER — FENTANYL CITRATE 0.05 MG/ML IJ SOLN
INTRAMUSCULAR | Status: AC
  Filled 2013-11-14: qty 5

## 2013-11-14 MED ORDER — FENTANYL CITRATE 0.05 MG/ML IJ SOLN
INTRAMUSCULAR | Status: DC | PRN
  Administered 2013-11-14: 50 ug via INTRAVENOUS
  Administered 2013-11-14: 100 ug via INTRAVENOUS
  Administered 2013-11-14 (×2): 50 ug via INTRAVENOUS

## 2013-11-14 MED ORDER — HYDROMORPHONE HCL PF 1 MG/ML IJ SOLN: 1.0000 mg | INTRAMUSCULAR | Status: DC | PRN

## 2013-11-14 MED ORDER — BUPIVACAINE-EPINEPHRINE PF 0.5-1:200000 % IJ SOLN
INTRAMUSCULAR | Status: DC | PRN
  Administered 2013-11-14: 150 mg via PERINEURAL

## 2013-11-14 MED ORDER — MIDAZOLAM HCL 5 MG/5ML IJ SOLN
INTRAMUSCULAR | Status: DC | PRN
  Administered 2013-11-14: 2 mg via INTRAVENOUS

## 2013-11-14 MED ORDER — ALUM & MAG HYDROXIDE-SIMETH 200-200-20 MG/5ML PO SUSP
30.0000 mL | ORAL | Status: DC | PRN
Start: 2013-11-14 — End: 2013-11-15

## 2013-11-14 MED ORDER — SENNA 8.6 MG PO TABS
1.0000 | ORAL_TABLET | Freq: Two times a day (BID) | ORAL | Status: DC
  Administered 2013-11-14 – 2013-11-15 (×3): 8.6 mg via ORAL
  Filled 2013-11-14 (×4): qty 1

## 2013-11-14 MED ORDER — METHOCARBAMOL 500 MG PO TABS
500.0000 mg | ORAL_TABLET | Freq: Four times a day (QID) | ORAL | Status: DC | PRN
  Administered 2013-11-15: 500 mg via ORAL
  Filled 2013-11-14: qty 1

## 2013-11-14 MED ORDER — PHENYLEPHRINE HCL 10 MG/ML IJ SOLN
INTRAMUSCULAR | Status: DC | PRN
  Administered 2013-11-14 (×3): 40 ug via INTRAVENOUS

## 2013-11-14 MED ORDER — DEXAMETHASONE SODIUM PHOSPHATE 10 MG/ML IJ SOLN
INTRAMUSCULAR | Status: DC | PRN
  Administered 2013-11-14: 8 mg

## 2013-11-14 MED ORDER — HYDROMORPHONE HCL PF 1 MG/ML IJ SOLN
INTRAMUSCULAR | Status: AC
  Filled 2013-11-14: qty 1

## 2013-11-14 MED ORDER — POTASSIUM CHLORIDE IN NACL 20-0.45 MEQ/L-% IV SOLN
INTRAVENOUS | Status: DC
  Administered 2013-11-14: 19:00:00 via INTRAVENOUS
  Filled 2013-11-14 (×4): qty 1000

## 2013-11-14 MED ORDER — KETOROLAC TROMETHAMINE 15 MG/ML IJ SOLN
7.5000 mg | Freq: Four times a day (QID) | INTRAMUSCULAR | Status: AC
  Administered 2013-11-14 – 2013-11-15 (×4): 7.5 mg via INTRAVENOUS
  Filled 2013-11-14: qty 1

## 2013-11-14 MED ORDER — OXYCODONE-ACETAMINOPHEN 10-325 MG PO TABS: 1.0000 | ORAL_TABLET | Freq: Four times a day (QID) | ORAL | Status: DC | PRN

## 2013-11-14 MED ORDER — DULOXETINE HCL 20 MG PO CPEP: 20.0000 mg | ORAL_CAPSULE | ORAL | Status: DC

## 2013-11-14 MED ORDER — OXYCODONE HCL 5 MG PO TABS
5.0000 mg | ORAL_TABLET | Freq: Once | ORAL | Status: AC | PRN
  Administered 2013-11-14: 5 mg via ORAL

## 2013-11-14 MED ORDER — CEFAZOLIN SODIUM-DEXTROSE 2-3 GM-% IV SOLR
2.0000 g | Freq: Four times a day (QID) | INTRAVENOUS | Status: AC
  Administered 2013-11-14 (×2): 2 g via INTRAVENOUS
  Filled 2013-11-14 (×2): qty 50

## 2013-11-14 MED ORDER — METHOCARBAMOL 100 MG/ML IJ SOLN
500.0000 mg | INTRAVENOUS | Status: AC
  Administered 2013-11-14: 500 mg via INTRAVENOUS
  Filled 2013-11-14: qty 5

## 2013-11-14 MED ORDER — PHENYLEPHRINE 40 MCG/ML (10ML) SYRINGE FOR IV PUSH (FOR BLOOD PRESSURE SUPPORT)
PREFILLED_SYRINGE | INTRAVENOUS | Status: AC
  Filled 2013-11-14: qty 10

## 2013-11-14 MED ORDER — METHOCARBAMOL 500 MG PO TABS: 500.0000 mg | ORAL_TABLET | Freq: Four times a day (QID) | ORAL | Status: DC

## 2013-11-14 MED ORDER — DEXAMETHASONE SODIUM PHOSPHATE 4 MG/ML IJ SOLN
INTRAMUSCULAR | Status: DC | PRN
  Administered 2013-11-14: 4 mg via INTRAVENOUS

## 2013-11-14 MED ORDER — OXYCODONE HCL 5 MG/5ML PO SOLN: 5.0000 mg | Freq: Once | ORAL | Status: AC | PRN

## 2013-11-14 MED ORDER — PROMETHAZINE HCL 25 MG/ML IJ SOLN: 6.2500 mg | INTRAMUSCULAR | Status: DC | PRN

## 2013-11-14 MED ORDER — ACETAMINOPHEN 325 MG PO TABS: 650.0000 mg | ORAL_TABLET | Freq: Four times a day (QID) | ORAL | Status: DC | PRN

## 2013-11-14 MED ORDER — METOCLOPRAMIDE HCL 5 MG/ML IJ SOLN: 5.0000 mg | Freq: Three times a day (TID) | INTRAMUSCULAR | Status: DC | PRN

## 2013-11-14 MED ORDER — CHLORHEXIDINE GLUCONATE CLOTH 2 % EX PADS: 6.0000 | MEDICATED_PAD | Freq: Once | CUTANEOUS | Status: DC

## 2013-11-14 MED ORDER — ENOXAPARIN SODIUM 30 MG/0.3ML ~~LOC~~ SOLN
30.0000 mg | Freq: Two times a day (BID) | SUBCUTANEOUS | Status: DC
  Administered 2013-11-15: 30 mg via SUBCUTANEOUS
  Filled 2013-11-14 (×3): qty 0.3

## 2013-11-14 MED ORDER — PROPOFOL 10 MG/ML IV BOLUS
INTRAVENOUS | Status: DC | PRN
  Administered 2013-11-14: 200 mg via INTRAVENOUS

## 2013-11-14 MED ORDER — SODIUM CHLORIDE 0.9 % IJ SOLN
INTRAMUSCULAR | Status: AC
  Filled 2013-11-14: qty 10

## 2013-11-14 MED ORDER — LACTATED RINGERS IV SOLN
INTRAVENOUS | Status: DC | PRN
  Administered 2013-11-14 (×2): via INTRAVENOUS

## 2013-11-14 MED ORDER — ROCURONIUM BROMIDE 50 MG/5ML IV SOLN
INTRAVENOUS | Status: AC
  Filled 2013-11-14: qty 1

## 2013-11-14 MED ORDER — LIDOCAINE HCL (CARDIAC) 20 MG/ML IV SOLN
INTRAVENOUS | Status: AC
  Filled 2013-11-14: qty 5

## 2013-11-14 SURGICAL SUPPLY — 72 items
APL SKNCLS STERI-STRIP NONHPOA (GAUZE/BANDAGES/DRESSINGS) ×1
BANDAGE ESMARK 6X9 LF (GAUZE/BANDAGES/DRESSINGS) ×1 IMPLANT
BENZOIN TINCTURE PRP APPL 2/3 (GAUZE/BANDAGES/DRESSINGS) ×1 IMPLANT
BLADE SAW RECIP 87.9 MT (BLADE) ×1 IMPLANT
BNDG CMPR 9X6 STRL LF SNTH (GAUZE/BANDAGES/DRESSINGS) ×1
BNDG ESMARK 6X9 LF (GAUZE/BANDAGES/DRESSINGS) ×2
BOOTCOVER CLEANROOM LRG (PROTECTIVE WEAR) ×2 IMPLANT
BOWL SMART MIX CTS (DISPOSABLE) ×4 IMPLANT
CAPT KNEE LEVEL 1 BIOMET ×1 IMPLANT
CEMENT HV SMART SET (Cement) ×4 IMPLANT
CLOTH BEACON ORANGE TIMEOUT ST (SAFETY) ×1 IMPLANT
CLSR STERI-STRIP ANTIMIC 1/2X4 (GAUZE/BANDAGES/DRESSINGS) ×2 IMPLANT
COVER SURGICAL LIGHT HANDLE (MISCELLANEOUS) ×2 IMPLANT
CUFF TOURNIQUET SINGLE 34IN LL (TOURNIQUET CUFF) ×1 IMPLANT
DRAPE EXTREMITY T 121X128X90 (DRAPE) ×2 IMPLANT
DRAPE PROXIMA HALF (DRAPES) ×2 IMPLANT
DRAPE U-SHAPE 47X51 STRL (DRAPES) ×2 IMPLANT
DRSG PAD ABDOMINAL 8X10 ST (GAUZE/BANDAGES/DRESSINGS) ×1 IMPLANT
DURAPREP 26ML APPLICATOR (WOUND CARE) ×3 IMPLANT
ELECT CAUTERY BLADE 6.4 (BLADE) ×2 IMPLANT
ELECT REM PT RETURN 9FT ADLT (ELECTROSURGICAL) ×2
ELECTRODE REM PT RTRN 9FT ADLT (ELECTROSURGICAL) ×1 IMPLANT
EVACUATOR 1/8 PVC DRAIN (DRAIN) IMPLANT
FACESHIELD LNG OPTICON STERILE (SAFETY) ×3 IMPLANT
GLOVE BIOGEL M 7.0 STRL (GLOVE) ×1 IMPLANT
GLOVE BIOGEL PI IND STRL 6.5 (GLOVE) IMPLANT
GLOVE BIOGEL PI IND STRL 7.0 (GLOVE) IMPLANT
GLOVE BIOGEL PI IND STRL 7.5 (GLOVE) ×1 IMPLANT
GLOVE BIOGEL PI IND STRL 8 (GLOVE) IMPLANT
GLOVE BIOGEL PI INDICATOR 6.5 (GLOVE) ×1
GLOVE BIOGEL PI INDICATOR 7.0 (GLOVE) ×2
GLOVE BIOGEL PI INDICATOR 7.5 (GLOVE)
GLOVE BIOGEL PI INDICATOR 8 (GLOVE) ×1
GLOVE BIOGEL PI ORTHO PRO SZ8 (GLOVE) ×1
GLOVE ORTHO TXT STRL SZ7.5 (GLOVE) ×2 IMPLANT
GLOVE PI ORTHO PRO STRL SZ8 (GLOVE) ×2 IMPLANT
GLOVE SURG ORTHO 8.0 STRL STRW (GLOVE) ×4 IMPLANT
GLOVE SURG SS PI 7.0 STRL IVOR (GLOVE) ×1 IMPLANT
GLOVE SURG SS PI 8.5 STRL IVOR (GLOVE) ×1
GLOVE SURG SS PI 8.5 STRL STRW (GLOVE) IMPLANT
GOWN STRL NON-REIN LRG LVL3 (GOWN DISPOSABLE) ×4 IMPLANT
HANDPIECE INTERPULSE COAX TIP (DISPOSABLE) ×2
HOOD PEEL AWAY FACE SHEILD DIS (HOOD) ×5 IMPLANT
IMMOBILIZER KNEE 24 THIGH 36 (MISCELLANEOUS) IMPLANT
IMMOBILIZER KNEE 24 UNIV (MISCELLANEOUS) ×2
KIT BASIN OR (CUSTOM PROCEDURE TRAY) ×2 IMPLANT
KIT ROOM TURNOVER OR (KITS) ×2 IMPLANT
KIT SAW BLADE (KITS) ×1 IMPLANT
MANIFOLD NEPTUNE II (INSTRUMENTS) ×2 IMPLANT
NS IRRIG 1000ML POUR BTL (IV SOLUTION) ×2 IMPLANT
PACK TOTAL JOINT (CUSTOM PROCEDURE TRAY) ×2 IMPLANT
PAD ARMBOARD 7.5X6 YLW CONV (MISCELLANEOUS) ×4 IMPLANT
PAD CAST 4YDX4 CTTN HI CHSV (CAST SUPPLIES) ×1 IMPLANT
PADDING CAST COTTON 4X4 STRL (CAST SUPPLIES) ×2
PADDING CAST COTTON 6X4 STRL (CAST SUPPLIES) ×2 IMPLANT
RUBBERBAND STERILE (MISCELLANEOUS) ×3 IMPLANT
SET HNDPC FAN SPRY TIP SCT (DISPOSABLE) ×1 IMPLANT
SPONGE GAUZE 4X4 12PLY (GAUZE/BANDAGES/DRESSINGS) ×2 IMPLANT
STAPLER VISISTAT 35W (STAPLE) ×1 IMPLANT
STRIP CLOSURE SKIN 1/2X4 (GAUZE/BANDAGES/DRESSINGS) ×1 IMPLANT
SUCTION FRAZIER TIP 10 FR DISP (SUCTIONS) ×2 IMPLANT
SUT MNCRL AB 4-0 PS2 18 (SUTURE) ×2 IMPLANT
SUT VIC AB 0 CT1 27 (SUTURE) ×4
SUT VIC AB 0 CT1 27XBRD ANBCTR (SUTURE) ×2 IMPLANT
SUT VIC AB 1 CT1 27 (SUTURE)
SUT VIC AB 1 CT1 27XBRD ANBCTR (SUTURE) ×2 IMPLANT
SUT VIC AB 3-0 SH 8-18 (SUTURE) ×2 IMPLANT
SYR 30ML LL (SYRINGE) ×1 IMPLANT
TOWEL OR 17X24 6PK STRL BLUE (TOWEL DISPOSABLE) ×2 IMPLANT
TOWEL OR 17X26 10 PK STRL BLUE (TOWEL DISPOSABLE) ×2 IMPLANT
TRAY FOLEY CATH 16FRSI W/METER (SET/KITS/TRAYS/PACK) IMPLANT
WATER STERILE IRR 1000ML POUR (IV SOLUTION) ×3 IMPLANT

## 2013-11-14 NOTE — Transfer of Care (Signed)
Immediate Anesthesia Transfer of Care Note  Patient: Alan Kennedy  Procedure(s) Performed: Procedure(s): UNICOMPARTMENTAL LEFT KNEE (Left)  Patient Location: PACU  Anesthesia Type:General  Level of Consciousness: sedated and patient cooperative  Airway & Oxygen Therapy: Patient Spontanous Breathing and Patient connected to nasal cannula oxygen  Post-op Assessment: Report given to PACU RN and Post -op Vital signs reviewed and stable  Post vital signs: Reviewed  Complications: No apparent anesthesia complications

## 2013-11-14 NOTE — Preoperative (Signed)
Beta Blockers   Reason not to administer Beta Blockers:Not Applicable 

## 2013-11-14 NOTE — Anesthesia Postprocedure Evaluation (Signed)
Anesthesia Post Note  Patient: Alan Kennedy  Procedure(s) Performed: Procedure(s) (LRB): UNICOMPARTMENTAL LEFT KNEE (Left)  Anesthesia type: general  Patient location: PACU  Post pain: Pain level controlled  Post assessment: Patient's Cardiovascular Status Stable  Post vital signs: Reviewed and stable  Level of consciousness: sedated  Complications: No apparent anesthesia complications

## 2013-11-14 NOTE — H&P (Signed)
  PREOPERATIVE H&P  Chief Complaint: DJD left knee  HPI: Alan Kennedy is a 53 y.o. male who presents for preoperative history and physical with a diagnosis of DJD left knee. Symptoms are rated as moderate to severe, and have been worsening.  This is significantly impairing activities of daily living.  He has elected for surgical management. He has had previous knee arthroscopy, and failed injections, anti-inflammatories, and also has chronic pain, with a spinal cord implant.  Past Medical History  Diagnosis Date  . Depression   . Arthritis     knees   Past Surgical History  Procedure Laterality Date  . Knee arthroscopy      30% cartiledge removal, 2x's on the R, multiple times on the L   . Hand reconstruction Left   . Shoulder surgery Right     arthroscopy  . Spinal cord stimulator implant  2009  . Back surgery      x3  . Nose surgery  2000    reconstruction    History   Social History  . Marital Status: Married    Spouse Name: N/A    Number of Children: N/A  . Years of Education: N/A   Social History Main Topics  . Smoking status: Never Smoker   . Smokeless tobacco: None  . Alcohol Use: No  . Drug Use: No  . Sexual Activity: None   Other Topics Concern  . None   Social History Narrative  . None   History reviewed. No pertinent family history. No Known Allergies Prior to Admission medications   Medication Sig Start Date End Date Taking? Authorizing Provider  DULoxetine (CYMBALTA) 20 MG capsule Take 20 mg by mouth every other day.   Yes Historical Provider, MD  ibuprofen (ADVIL,MOTRIN) 200 MG tablet Take 400-600 mg by mouth every 6 (six) hours as needed.   Yes Historical Provider, MD  Multiple Vitamins-Minerals (MULTIVITAMIN WITH MINERALS) tablet Take 1 tablet by mouth daily.   Yes Historical Provider, MD     Positive ROS: All other systems have been reviewed and were otherwise negative with the exception of those mentioned in the HPI and as  above.  Physical Exam: General: Alert, no acute distress Cardiovascular: No pedal edema Respiratory: No cyanosis, no use of accessory musculature GI: No organomegaly, abdomen is soft and non-tender Skin: No lesions in the area of chief complaint Neurologic: Sensation intact distally Psychiatric: Patient is competent for consent with normal mood and affect Lymphatic: No axillary or cervical lymphadenopathy  MUSCULOSKELETAL: Left knee has medial joint line tenderness, range of motion from 0-130. Minimal effusion.  X-rays demonstrate narrowing of the medial compartment, although is not completely end-stage, but it does appear to have lost joint space compared to the contralateral side.  Assessment: Left medial compartment osteoarthritis, moderate  Plan: Plan for Procedure(s): UNICOMPARTMENTAL LEFT KNEE  The risks benefits and alternatives were discussed with the patient including but not limited to the risks of nonoperative treatment, versus surgical intervention including infection, bleeding, nerve injury,  blood clots, cardiopulmonary complications, morbidity, mortality, among others, and they were willing to proceed. He is frustrated because of the persistent medial sided pain without relief. We've also discussed the risks for incomplete relief of symptoms, particularly given the chronic pain syndrome.  Johnny Bridge, MD Cell [phone]   11/14/2013 7:11 AM

## 2013-11-14 NOTE — Discharge Instructions (Signed)
Diet: As you were doing prior to hospitalization  ° °Shower:  May shower but keep the wounds dry, use an occlusive plastic wrap, NO SOAKING IN TUB.  If the bandage gets wet, change with a clean dry gauze. ° °Dressing:  You may change your dressing 3-5 days after surgery.  Then change the dressing daily with sterile gauze dressing.   ° °There are sticky tapes (steri-strips) on your wounds and all the stitches are absorbable.  Leave the steri-strips in place when changing your dressings, they will peel off with time, usually 2-3 weeks. ° °Activity:  Increase activity slowly as tolerated, but follow the weight bearing instructions below.  No lifting or driving for 6 weeks. ° °Weight Bearing:   As tolerated.   ° °To prevent constipation: you may use a stool softener such as - ° °Colace (over the counter) 100 mg by mouth twice a day  °Drink plenty of fluids (prune juice may be helpful) and high fiber foods °Miralax (over the counter) for constipation as needed.   ° °Itching:  If you experience itching with your medications, try taking only a single pain pill, or even half a pain pill at a time.  You may take up to 10 pain pills per day, and you can also use benadryl over the counter for itching or also to help with sleep.  ° °Precautions:  If you experience chest pain or shortness of breath - call 911 immediately for transfer to the hospital emergency department!! ° °If you develop a fever greater that 101 F, purulent drainage from wound, increased redness or drainage from wound, or calf pain -- Call the office at [phone]                                                °Follow- Up Appointment:  Please call for an appointment to be seen in 2 weeks [place] - [phone] ° ° ° ° ° °

## 2013-11-14 NOTE — Anesthesia Preprocedure Evaluation (Addendum)
Anesthesia Evaluation  Patient identified by MRN, date of birth, ID band Patient awake    Reviewed: Allergy & Precautions, H&P , NPO status , Patient's Chart, lab work & pertinent test results  History of Anesthesia Complications (+) PONV and history of anesthetic complications  Airway Mallampati: I TM Distance: >3 FB Neck ROM: Full    Dental  (+) Edentulous Upper, Edentulous Lower, Dental Advisory Given   Pulmonary neg pulmonary ROS,  breath sounds clear to auscultation        Cardiovascular negative cardio ROS  Rhythm:Regular Rate:Normal     Neuro/Psych PSYCHIATRIC DISORDERS Depression negative neurological ROS     GI/Hepatic negative GI ROS, Neg liver ROS,   Endo/Other  negative endocrine ROS  Renal/GU negative Renal ROS     Musculoskeletal   Abdominal   Peds  Hematology   Anesthesia Other Findings   Reproductive/Obstetrics negative OB ROS                          Anesthesia Physical Anesthesia Plan  ASA: II  Anesthesia Plan: General LMA   Post-op Pain Management: MAC Combined w/ Regional for Post-op pain   Induction:   Airway Management Planned:   Additional Equipment:   Intra-op Plan:   Post-operative Plan:   Informed Consent:   Plan Discussed with: Anesthesiologist, CRNA and Surgeon  Anesthesia Plan Comments:         Anesthesia Quick Evaluation

## 2013-11-14 NOTE — Progress Notes (Signed)
Orthopedic Tech Progress Note Patient Details:  Alan Kennedy November 30, 1960 [id number]  Ortho Devices Type of Ortho Device: Knee Immobilizer Ortho Device/Splint Interventions: Application   [name], Theodoro Parma 11/14/2013, 1:14 PM

## 2013-11-14 NOTE — Anesthesia Procedure Notes (Addendum)
Anesthesia Regional Block:  Femoral nerve block  Pre-Anesthetic Checklist: ,, timeout performed, Correct Patient, Correct Site, Correct Laterality, Correct Procedure, Correct Position, site marked, Risks and benefits discussed,  Surgical consent,  Pre-op evaluation,  At surgeon's request and post-op pain management  Laterality: Left  Prep: chloraprep       Needles:  Injection technique: Single-shot  Needle Type: Echogenic Stimulator Needle     Needle Length: 5cm 5 cm Needle Gauge: 22 and 22 G    Additional Needles:  Procedures: ultrasound guided (picture in chart) and nerve stimulator Femoral nerve block  Nerve Stimulator or Paresthesia:  Response: quadraceps contraction, 0.45 mA,   Additional Responses:   Narrative:  Start time: 11/14/2013 7:09 AM End time: 11/14/2013 7:19 AM Injection made incrementally with aspirations every 5 mL.  Performed by: Personally  Anesthesiologist: Earnest Bailey, MD  Additional Notes: Functioning IV was confirmed and monitors were applied.  A 32mm 22ga Arrow echogenic stimulator needle was used. Sterile prep and drape,hand hygiene and sterile gloves were used. Ultrasound guidance: relevant anatomy identified, needle position confirmed, local anesthetic spread visualized around nerve(s)., vascular puncture avoided.  Image printed for medical record. Negative aspiration and negative test dose prior to incremental administration of local anesthetic. The patient tolerated the procedure well.     Procedure Name: LMA Insertion Date/Time: 11/14/2013 7:35 AM Performed by: Luciana Axe K Pre-anesthesia Checklist: Patient identified, Emergency Drugs available, Suction available and Timeout performed Patient Re-evaluated:Patient Re-evaluated prior to inductionOxygen Delivery Method: Circle system utilized Preoxygenation: Pre-oxygenation with 100% oxygen Intubation Type: IV induction Ventilation: Mask ventilation without difficulty LMA: LMA  inserted LMA Size: 5.0 Number of attempts: 1 Placement Confirmation: positive ETCO2,  CO2 detector and breath sounds checked- equal and bilateral Tube secured with: Tape Dental Injury: Teeth and Oropharynx as per pre-operative assessment

## 2013-11-14 NOTE — Op Note (Signed)
11/14/2013  9:19 AM  PATIENT:  [name]    Alan DIAGNOSIS:  DJD left medial knee  POST-OPERATIVE DIAGNOSIS:  Same  PROCEDURE:  UNICOMPARTMENTAL LEFT KNEE  SURGEON:  Johnny Bridge, MD  PHYSICIAN ASSISTANT: Joya Gaskins, OPA-C, present and scrubbed throughout the case, critical for completion in a timely fashion, and for retraction, instrumentation, and closure.  ANESTHESIA:   General  PREOPERATIVE INDICATIONS:  Alan Kennedy is a  53 y.o. male with a diagnosis of DJD left knee who failed conservative measures and elected for surgical management.  He has had previous knee arthroscopy, injections, activity modification,  The risks benefits and alternatives were discussed with the patient preoperatively including but not limited to the risks of infection, bleeding, nerve injury, cardiopulmonary complications, blood clots, the need for revision surgery, among others, and the patient was willing to proceed.  OPERATIVE IMPLANTS: Biomet Oxford mobile bearing medial compartment arthroplasty femur size medium, tibia size C, bearing size 5.  OPERATIVE FINDINGS: Grade 2 and grade 3 medial femoral and tibial chondral changes. There was no complete exposed bone. No significant changes in the lateral or patellofemoral joint.  The ACL was intact.  OPERATIVE PROCEDURE: The patient was brought to the operating room placed in supine position. General anesthesia was administered. IV antibiotics were given. The lower extremity was placed in the legholder and prepped and draped in usual sterile fashion.  Time out was performed.  The leg was elevated and exsanguinated and the tourniquet was inflated. Anteromedial incision was performed, and I took care to preserve the MCL. Parapatellar incision was carried out, and the osteophytes were excised, along with the medial meniscus and a small portion of the fat pad.  The extra medullary tibial cutting jig was applied, using the spoon and  the 49mm G-Clamp, and I took care to protect the anterior cruciate ligament insertion and the tibial spine. The medial collateral ligament was also protected, and I resected my proximal tibia, matching the anatomic slope. Initially I made the cut with the 2 mm shim.  The proximal tibial bony cut was removed in one piece, and I measured my gap, but I could not fit a 3 mm spacer in place, and so I went back, remove the 2 mm shim, and cut with a 0 shim. This provided adequate bony resection, and I was able to fit between a 4 and a 5 into place.  I turned my attention to the femur.  The intramedullary femoral rod was placed using the drill, and then using the appropriate reference, I assembled the femoral jig, setting my posterior cutting block. I resected my posterior femur, and then used the 0 spigot to resect the anterior femur, and then measured my gap.   I then used the mill to match the extension gap to the flexion gap. The flexion gap measured 5, and the extension gap measured 2, and I used the 3 mm spigot. The gaps were then measured again with the appropriate feeler gauges. Once I had balanced flexion and extension gaps, I then completed the preparation of the femur.  I milled off the anterior aspect of the distal femur to prevent impingement. I also exposed the tibia, and selected the above-named component, and then used the cutting jig to prepare the keel slot on the tibia. I also used the awl to curette out the bone to complete the preparation of the keel. The back wall was intact.  I then placed trial components, and it was found to  have excellent motion, and appropriate balance.  I then cemented the components into place, cementing the tibia first, removing all excess cement, and then cementing the femur.  All loose cement was removed.  The real polyethylene insert was applied manually, and the knee was taken through functional range of motion, and found to have excellent stability and  restoration of joint motion, with excellent balance.  The wounds were irrigated copiously, and the parapatellar tissue closed with Vicryl, followed by Vicryl for the subcutaneous tissue, with routine closure with Steri-Strips and sterile gauze.  The tourniquet was released, and the patient was awakened and extubated and returned to PACU in stable and satisfactory condition. There were no complications.

## 2013-11-15 ENCOUNTER — Encounter (HOSPITAL_COMMUNITY): Payer: Self-pay | Admitting: General Practice

## 2013-11-15 LAB — CBC
HCT: 43.3 % (ref 39.0–52.0)
Hemoglobin: 14.4 g/dL (ref 13.0–17.0)
MCH: 30.1 pg (ref 26.0–34.0)
MCHC: 33.3 g/dL (ref 30.0–36.0)
MCV: 90.6 fL (ref 78.0–100.0)
Platelets: 392 10*3/uL (ref 150–400)
RBC: 4.78 MIL/uL (ref 4.22–5.81)
RDW: 13.3 % (ref 11.5–15.5)
WBC: 21.7 10*3/uL — ABNORMAL HIGH (ref 4.0–10.5)

## 2013-11-15 LAB — BASIC METABOLIC PANEL
BUN: 11 mg/dL (ref 6–23)
CALCIUM: 9.6 mg/dL (ref 8.4–10.5)
CO2: 24 mEq/L (ref 19–32)
CREATININE: 1.3 mg/dL (ref 0.50–1.35)
Chloride: 100 mEq/L (ref 96–112)
GFR calc non Af Amer: 62 mL/min — ABNORMAL LOW (ref >90)
GFR, EST AFRICAN AMERICAN: 71 mL/min — AB (ref >90)
Glucose, Bld: 118 mg/dL — ABNORMAL HIGH (ref 70–99)
POTASSIUM: 4.3 meq/L (ref 3.7–5.3)
Sodium: 137 mEq/L (ref 137–147)

## 2013-11-15 NOTE — Progress Notes (Signed)
Patient ID: Alan Kennedy, male   DOB: 1960-12-30, 53 y.o.   MRN: [id number]     Subjective:  Patient reports pain as mild.  Patient sitting up in bed in no acute distress.  Denies any CP or SOB. Patient states that he would like to be Discharged home  Objective:   VITALS:   Filed Vitals:   11/14/13 2004 11/15/13 0000 11/15/13 0400 11/15/13 0434  BP: 127/62   120/63  Pulse: 82   73  Temp: 98.1 F (36.7 C)   98 F (36.7 C)  TempSrc: Oral   Oral  Resp: 18 18 18 20   SpO2: 93% 93% 94% 93%    ABD soft Sensation intact distally Dorsiflexion/Plantar flexion intact Incision: dressing C/D/I and no drainage Patient sitting in the knee immobilizer. EHF/FHL firing  Lab Results  Component Value Date   WBC 21.7* 11/15/2013   HGB 14.4 11/15/2013   HCT 43.3 11/15/2013   MCV 90.6 11/15/2013   PLT 392 11/15/2013     Assessment/Plan: 1 Day Post-Op   Principal Problem:   Osteoarthritis of left knee, medial Active Problems:   Knee osteoarthritis   Advance diet Up with therapy Plan for DC home if passes PT NWB left lower ext. DC knee Immobilizer Follow up with Dr Mardelle Matte in Two weeks.   Remonia Richter 11/15/2013, 7:42 AM   Seen and agree with above.  Marchia Bond, MD Cell [phone]

## 2013-11-15 NOTE — Progress Notes (Signed)
Lovenox teaching done with Pt and his wife. Pt verbalized understanding of how to admin injection and had no questions concerning Lovenox.

## 2013-11-15 NOTE — Plan of Care (Signed)
Problem: Consults Goal: Diagnosis- Total Joint Replacement Outcome: Completed/Met Date Met:  11/15/13 Partial/Uniknee, Left

## 2013-11-15 NOTE — Evaluation (Signed)
Physical Therapy Evaluation Patient Details Name: Alan Kennedy MRN: [id number] DOB: November 22, 1960 Today's Date: 11/15/2013 Time: 0832-0909 PT Time Calculation (min): 37 min  PT Assessment / Plan / Recommendation History of Present Illness  S/p L medial Unicompartmental knee arthroplasty; NWB postop  Clinical Impression  Patient evaluated by Physical Therapy with no further acute PT needs identified. All education has been completed and the patient has no further questions. Pt and wife had specific question re: brace wear, positioning at rest for optimal knee extension, HEP; See below for any follow-up Physical Therapy or equipment needs. PT is signing off. Thank you for this referral.  Will benefit from HHPT to reinforce proper therex Deferred brace questions to Ortho  The potential need for Outpatient PT can be addressed at Ortho follow-up appointments.      PT Assessment  All further PT needs can be met in the next venue of care    Follow Up Recommendations  Home health PT;Supervision - Intermittent    Does the patient have the potential to tolerate intense rehabilitation      Barriers to Discharge        Equipment Recommendations  None recommended by PT    Recommendations for Other Services     Frequency      Precautions / Restrictions Precautions Precautions: Knee Restrictions Weight Bearing Restrictions: Yes LLE Weight Bearing: Non weight bearing   Pertinent Vitals/Pain 7/10 L knee  Ice applied      Mobility  Bed Mobility Overal bed mobility: Modified Independent Transfers Overall transfer level: Modified independent Equipment used: Crutches Ambulation/Gait Ambulation/Gait assistance: Modified independent (Device/Increase time) Ambulation Distance (Feet): 400 Feet (total) Assistive device: Rolling walker (2 wheeled);Crutches Gait Pattern/deviations: Step-through pattern General Gait Details: Managing well; prefers crutches over RW, and this therapist  agrees Stairs: Yes Stairs assistance: Supervision Stair Management: No rails;Forwards;With crutches Number of Stairs: 3 (x2) General stair comments: cues for sequence    Exercises Total Joint Exercises Quad Sets: AROM;Left;10 reps Heel Slides: AROM;Left;5 reps   PT Diagnosis: Abnormality of gait;Acute pain  PT Problem List: Decreased strength;Decreased range of motion;Pain PT Treatment Interventions:       PT Goals(Current goals can be found in the care plan section) Acute Rehab PT Goals Patient Stated Goal: Tracking PT Goal Formulation: No goals set, d/c therapy (dc acute therapy)  Visit Information  Last PT Received On: 11/15/13 Assistance Needed: +1 History of Present Illness: S/p L medial Unicompartmental knee arthroplasty; NWB postop       Prior Functioning  Home Living Family/patient expects to be discharged to:: Private residence Living Arrangements: Spouse/significant other Available Help at Discharge: Family;Available 24 hours/day Type of Home: House Home Access: Stairs to enter [employer] of Steps: 3 Entrance Stairs-Rails: None Home Layout: One level Home Equipment: Crutches;Other (comment) (high toilet) Prior Function Level of Independence: Independent;Independent with assistive device(s) Comments: numerous prior knee surgeries; independent with crutches Communication Communication: No difficulties    Cognition  Cognition Arousal/Alertness: Awake/alert Behavior During Therapy: WFL for tasks assessed/performed Overall Cognitive Status: Within Functional Limits for tasks assessed    Extremity/Trunk Assessment Upper Extremity Assessment Upper Extremity Assessment: Overall WFL for tasks assessed Lower Extremity Assessment Lower Extremity Assessment: LLE deficits/detail LLE Deficits / Details: NWB, groslly decr quad activation postop   Balance Balance Overall balance assessment: No apparent balance deficits (not formally assessed)  End of  Session PT - End of Session Activity Tolerance: Patient tolerated treatment well Patient left: in chair;with call bell/phone within reach;with family/visitor present  Nurse Communication: Mobility status;Other (comment) (OK for dc)  GP     Roney Marion [place], [place]  11/15/2013, 10:53 AM

## 2013-11-15 NOTE — Discharge Summary (Addendum)
Physician Discharge Summary  Patient ID: Alan Kennedy MRN: [id number] DOB/AGE: Aug 06, 1961 52 y.o.  Admit date: 11/14/2013 Discharge date: 11/15/2013  Admission Diagnoses:  Osteoarthritis of left knee  Discharge Diagnoses:  Principal Problem:   Osteoarthritis of left knee, medial Active Problems:   Knee osteoarthritis   Past Medical History  Diagnosis Date  . Depression   . Arthritis     knees  . Osteoarthritis of left knee, medial 11/14/2013    Surgeries: Procedure(s): UNICOMPARTMENTAL LEFT KNEE on 11/14/2013   Consultants (if any):    Discharged Condition: Improved  Hospital Course: Alan Kennedy is an 53 y.o. male who was admitted 11/14/2013 with a diagnosis of Osteoarthritis of left knee and went to the operating room on 11/14/2013 and underwent the above named procedures.    He was given perioperative antibiotics:      Anti-infectives   Start     Dose/Rate Route Frequency Ordered Stop   11/14/13 1330  ceFAZolin (ANCEF) IVPB 2 g/50 mL premix     2 g 100 mL/hr over 30 Minutes Intravenous Every 6 hours 11/14/13 1146 11/14/13 2041   11/14/13 0600  ceFAZolin (ANCEF) IVPB 2 g/50 mL premix     2 g 100 mL/hr over 30 Minutes Intravenous On call to O.R. 11/13/13 1345 11/14/13 0735    .  He was given sequential compression devices, early ambulation, and lovenox for DVT prophylaxis.  There was a small cortical perforation in the medial condyle recognized postoperatively on radiographs, and so I changed him to NWB LLE for 6 weeks.  He benefited maximally from the hospital stay and there were no complications.    Recent vital signs:  Filed Vitals:   11/15/13 0800  BP:   Pulse:   Temp:   Resp: 18    Recent laboratory studies:  Lab Results  Component Value Date   HGB 14.4 11/15/2013   HGB 14.6 11/14/2013   HGB 16.7 11/06/2013   Lab Results  Component Value Date   WBC 21.7* 11/15/2013   PLT 392 11/15/2013   Lab Results  Component Value Date   INR 1.0  05/29/2008   Lab Results  Component Value Date   NA 137 11/15/2013   K 4.3 11/15/2013   CL 100 11/15/2013   CO2 24 11/15/2013   BUN 11 11/15/2013   CREATININE 1.30 11/15/2013   GLUCOSE 118* 11/15/2013    Discharge Medications:     Medication List    STOP taking these medications       ibuprofen 200 MG tablet  Commonly known as:  ADVIL,MOTRIN      TAKE these medications       DULoxetine 20 MG capsule  Commonly known as:  CYMBALTA  Take 20 mg by mouth every other day.     enoxaparin 30 MG/0.3ML injection  Commonly known as:  LOVENOX  Inject 0.3 mLs (30 mg total) into the skin every 12 (twelve) hours.     methocarbamol 500 MG tablet  Commonly known as:  ROBAXIN  Take 1 tablet (500 mg total) by mouth 4 (four) times daily.     multivitamin with minerals tablet  Take 1 tablet by mouth daily.     oxyCODONE-acetaminophen 10-325 MG per tablet  Commonly known as:  PERCOCET  Take 1-2 tablets by mouth every 6 (six) hours as needed for pain. MAXIMUM TOTAL ACETAMINOPHEN DOSE IS 4000 MG PER DAY     promethazine 25 MG tablet  Commonly known as:  PHENERGAN  Take  1 tablet (25 mg total) by mouth every 6 (six) hours as needed for nausea or vomiting.        Diagnostic Studies: Dg Knee Left Port  11/14/2013   CLINICAL DATA:  Status post arthroplasty  EXAM: PORTABLE LEFT KNEE - 1-2 VIEW  COMPARISON:  06/23/2010  FINDINGS: The patient is status post partial right knee arthroplasty. The hardware components involve the medial compartment of the left knee. These are in anatomic alignment and no complicating features identified. Gas within the soft tissues surrounding the left knee and within the left knee joint noted.  IMPRESSION: 1. No complications status post partial left knee arthroplasty.   Electronically Signed   By: Kerby Moors M.D.   On: 11/14/2013 10:39    Disposition: 01-Home or Self Care  Discharge Orders   Future Orders Complete By Expires   Call MD / Call 911  As directed     Comments:     If you experience chest pain or shortness of breath, CALL 911 and be transported to the hospital emergency room.  If you develope a fever above 101 F, pus (white drainage) or increased drainage or redness at the wound, or calf pain, call your surgeon's office.   Change dressing  As directed    Comments:     Change dressing in three days, then change the dressing daily with sterile 4 x 4 inch gauze dressing.  You may clean the incision with alcohol prior to redressing.   Constipation Prevention  As directed    Comments:     Drink plenty of fluids.  Prune juice may be helpful.  You may use a stool softener, such as Colace (over the counter) 100 mg twice a day.  Use MiraLax (over the counter) for constipation as needed.   Diet general  As directed    Discharge instructions  As directed    Comments:     Change dressing in 3 days and reapply fresh dressing, unless you have a splint (half cast).  If you have a splint/cast, just leave in place until your follow-up appointment.    Keep wounds dry for 3 weeks.  Leave steri-strips in place on skin.  Do not apply lotion or anything to the wound.   Do not put a pillow under the knee. Place it under the heel.  As directed    Non weight bearing  As directed    Questions:     Laterality:     Extremity:     TED hose  As directed    Comments:     Use stockings (TED hose) for 2 weeks on both leg(s).  You may remove them at night for sleeping.      Follow-up Information   Follow up with Johnny Bridge, MD. Schedule an appointment as soon as possible for a visit in 2 weeks.   Specialty:  Orthopedic Surgery   Contact information:   [place] [address] [phone]        Signed: Johnny Bridge 11/15/2013, 8:17 AM

## 2013-11-15 NOTE — Care Management Note (Signed)
CARE MANAGEMENT NOTE 11/15/2013  Patient:  Alan Kennedy, Alan Kennedy   Account Number:  [id number]  Date Initiated:  11/15/2013  Documentation initiated by:  Ricki Miller  Subjective/Objective Assessment:   53yr old male s/p left unicompartmental knee.     Action/Plan:   Case Manager spoke with patient and wife concerning discharge plans and DME needs. Patient is under worker's comp. faxing orders and H&P,OP report to [phone]   Anticipated DC Date:  11/15/2013   Anticipated DC Plan:  [place]  CM consult      [facility] Choice  HOME HEALTH   Choice offered to / List presented to:     DME arranged  TUB BENCH        HH arranged  HH-2 PT      Status of service:  Completed, signed off Medicare Important Message given?   (If response is "NO", the following Medicare IM given date fields will be blank) Date Medicare IM given:   Date Additional Medicare IM given:    Discharge Disposition:  [place]  Per UR Regulation:    If discussed at Long Length of Stay Meetings, dates discussed:    Comments:  11/15/13 10:52am Ricki Miller, RN BSN Case Manager [phone] Patient will use his crutches at home. Worker's comp Tourist information centre manager will contact him concerning home health agency that will service him. They will also have tube seat delivered.

## 2013-11-15 NOTE — Evaluation (Signed)
Occupational Therapy Evaluation Patient Details Name: Alan Kennedy MRN: [id number] DOB: 1960/12/15 Today's Date: 11/15/2013 Time: 4008-6761 OT Time Calculation (min): 16 min  OT Assessment / Plan / Recommendation History of present illness S/p L medial Unicompartmental knee arthroplasty; NWB postop   Clinical Impression   Patient evaluated by Occupational Therapy with no further acute OT needs identified. All education has been completed and the patient has no further questions. See below for any follow-up Occupational Therapy or equipment needs. OT to sign off. Thank you for referral.      OT Assessment  Patient does not need any further OT services    Follow Up Recommendations  No OT follow up    Barriers to Discharge      Equipment Recommendations  Tub/shower seat    Recommendations for Other Services    Frequency       Precautions / Restrictions Precautions Precautions: Knee Restrictions LLE Weight Bearing: Non weight bearing   Pertinent Vitals/Pain No pain reported    ADL  Eating/Feeding: Modified independent Where Assessed - Eating/Feeding: Chair Grooming: Wash/dry hands;Wash/dry face;Supervision/safety Where Assessed - Grooming: Unsupported standing Toilet Transfer: Supervision/safety Armed forces technical officer Method: Sit to Loss adjuster, chartered: Regular height toilet Equipment Used: Other (comment) (crutches) Transfers/Ambulation Related to ADLs: pt completed chair <.> toilet transfer with education on safety and hand placement ADL Comments: pt dressed on arrival with wife (A) mod I. pt and wife educated on dressing surg side first. pt educated on toilet transfer and verbally educated on shower transfer. Pt's wife asking questions about her elderly father with pending knee surg. PT's wife advised to advocate for patient as needed and to discuss details with evaluating therapist after his surg. OT returned to current patient education.     OT Diagnosis:     OT Problem List:   OT Treatment Interventions:     OT Goals(Current goals can be found in the care plan section)    Visit Information  Last OT Received On: 11/15/13 Assistance Needed: +1 History of Present Illness: S/p L medial Unicompartmental knee arthroplasty; NWB postop       Prior Functioning     Home Living Family/patient expects to be discharged to:: Private residence Living Arrangements: Spouse/significant other Available Help at Discharge: Family;Available 24 hours/day Type of Home: House Home Access: Stairs to enter [employer] of Steps: 3 Entrance Stairs-Rails: None Home Layout: One level Home Equipment: Crutches;Other (comment) (high toilet) Prior Function Level of Independence: Independent;Independent with assistive device(s) Comments: numerous prior knee surgeries; independent with crutches Communication Communication: No difficulties Dominant Hand: Right         Vision/Perception Vision - History Baseline Vision: No visual deficits Perception Perception: Within Functional Limits Praxis Praxis: Intact   Cognition  Cognition Arousal/Alertness: Awake/alert Behavior During Therapy: WFL for tasks assessed/performed Overall Cognitive Status: Within Functional Limits for tasks assessed    Extremity/Trunk Assessment Upper Extremity Assessment Upper Extremity Assessment: Overall WFL for tasks assessed Lower Extremity Assessment Lower Extremity Assessment: Defer to PT evaluation Cervical / Trunk Assessment Cervical / Trunk Assessment: Normal     Mobility Bed Mobility Overal bed mobility: Modified Independent Transfers Overall transfer level: Modified independent Equipment used: Crutches     Exercise     Balance Balance Overall balance assessment: No apparent balance deficits (not formally assessed)   End of Session OT - End of Session Activity Tolerance: Patient tolerated treatment well Patient left: in chair;with call  bell/phone within reach;with family/visitor present  GO  Peri Maris 11/15/2013, 3:24 PM Pager: [phone]

## 2013-11-15 NOTE — Progress Notes (Signed)
Utilization review completed.  

## 2013-11-17 ENCOUNTER — Encounter (HOSPITAL_COMMUNITY): Payer: Self-pay | Admitting: Orthopedic Surgery

## 2014-06-27 ENCOUNTER — Encounter (HOSPITAL_COMMUNITY): Payer: Self-pay | Admitting: Emergency Medicine

## 2014-06-27 ENCOUNTER — Emergency Department (HOSPITAL_COMMUNITY): Payer: 59

## 2014-06-27 ENCOUNTER — Emergency Department (HOSPITAL_COMMUNITY)
Admission: EM | Admit: 2014-06-27 | Discharge: 2014-06-27 | Disposition: A | Discharge status: Home / Self Care | Payer: 59 | Attending: Emergency Medicine | Admitting: Emergency Medicine

## 2014-06-27 DIAGNOSIS — F3289 Other specified depressive episodes: Secondary | ICD-10-CM | POA: Diagnosis not present

## 2014-06-27 DIAGNOSIS — Z8739 Personal history of other diseases of the musculoskeletal system and connective tissue: Secondary | ICD-10-CM | POA: Insufficient documentation

## 2014-06-27 DIAGNOSIS — Z79899 Other long term (current) drug therapy: Secondary | ICD-10-CM | POA: Diagnosis not present

## 2014-06-27 DIAGNOSIS — R079 Chest pain, unspecified: Secondary | ICD-10-CM

## 2014-06-27 DIAGNOSIS — F329 Major depressive disorder, single episode, unspecified: Secondary | ICD-10-CM | POA: Diagnosis not present

## 2014-06-27 DIAGNOSIS — Z7901 Long term (current) use of anticoagulants: Secondary | ICD-10-CM | POA: Insufficient documentation

## 2014-06-27 DIAGNOSIS — Z862 Personal history of diseases of the blood and blood-forming organs and certain disorders involving the immune mechanism: Secondary | ICD-10-CM | POA: Insufficient documentation

## 2014-06-27 DIAGNOSIS — Z8639 Personal history of other endocrine, nutritional and metabolic disease: Secondary | ICD-10-CM | POA: Diagnosis not present

## 2014-06-27 LAB — I-STAT TROPONIN, ED
TROPONIN I, POC: 0.03 ng/mL (ref 0.00–0.08)
Troponin i, poc: 0 ng/mL (ref 0.00–0.08)

## 2014-06-27 LAB — CBC
HCT: 45.7 % (ref 39.0–52.0)
Hemoglobin: 16 g/dL (ref 13.0–17.0)
MCH: 31.4 pg (ref 26.0–34.0)
MCHC: 35 g/dL (ref 30.0–36.0)
MCV: 89.8 fL (ref 78.0–100.0)
PLATELETS: 320 10*3/uL (ref 150–400)
RBC: 5.09 MIL/uL (ref 4.22–5.81)
RDW: 12.9 % (ref 11.5–15.5)
WBC: 9.6 10*3/uL (ref 4.0–10.5)

## 2014-06-27 LAB — COMPREHENSIVE METABOLIC PANEL
ALBUMIN: 4 g/dL (ref 3.5–5.2)
ALK PHOS: 81 U/L (ref 39–117)
ALT: 22 U/L (ref 0–53)
AST: 18 U/L (ref 0–37)
Anion gap: 17 — ABNORMAL HIGH (ref 5–15)
BUN: 7 mg/dL (ref 6–23)
CALCIUM: 9.5 mg/dL (ref 8.4–10.5)
CO2: 24 mEq/L (ref 19–32)
Chloride: 98 mEq/L (ref 96–112)
Creatinine, Ser: 0.94 mg/dL (ref 0.50–1.35)
GFR calc Af Amer: >90 mL/min (ref >90)
GFR calc non Af Amer: >90 mL/min (ref >90)
Glucose, Bld: 109 mg/dL — ABNORMAL HIGH (ref 70–99)
POTASSIUM: 3.8 meq/L (ref 3.7–5.3)
SODIUM: 139 meq/L (ref 137–147)
TOTAL PROTEIN: 7.9 g/dL (ref 6.0–8.3)
Total Bilirubin: 0.5 mg/dL (ref 0.3–1.2)

## 2014-06-27 NOTE — ED Provider Notes (Signed)
CSN: [id number]     Arrival date & time 06/27/14  1202 History   First MD Initiated Contact with Patient 06/27/14 1309     Chief Complaint  Patient presents with  . Chest Pain     (Consider location/radiation/quality/duration/timing/severity/associated sxs/prior Treatment) Patient is a 53 y.o. male presenting with chest pain. The history is provided by the patient.  Chest Pain Pain location:  R chest Pain quality: tightness   Pain quality: not crushing, no pressure, not sharp and not shooting   Pain radiates to:  Does not radiate Pain radiates to the back: no   Pain severity:  Moderate Onset quality:  Sudden Duration:  2 hours Timing:  Intermittent Progression:  Resolved Chronicity:  New Context: at rest   Relieved by:  Nothing Worsened by:  Nothing tried Associated symptoms: no abdominal pain, no back pain, no cough, no fever, no shortness of breath and not vomiting     Past Medical History  Diagnosis Date  . Depression   . Arthritis     knees  . Osteoarthritis of left knee, medial 11/14/2013  . Gout    Past Surgical History  Procedure Laterality Date  . Knee arthroscopy      30% cartiledge removal, 2x's on the R, multiple times on the L   . Hand reconstruction Left   . Shoulder surgery Right     arthroscopy  . Spinal cord stimulator implant  2009  . Back surgery      x3  . Nose surgery  2000    reconstruction   . Knee surgery Left 11/14/2013    DR LANDAU            UNICOMPARTMENTAL   . Partial knee arthroplasty Left 11/14/2013    Procedure: UNICOMPARTMENTAL LEFT KNEE;  Surgeon: Johnny Bridge, MD;  Location: [facility];  Service: Orthopedics;  Laterality: Left;   No family history on file. History  Substance Use Topics  . Smoking status: Never Smoker   . Smokeless tobacco: Never Used  . Alcohol Use: No    Review of Systems  Constitutional: Negative for fever.  Respiratory: Negative for cough and shortness of breath.   Cardiovascular: Positive for chest  pain.  Gastrointestinal: Negative for vomiting and abdominal pain.  Musculoskeletal: Negative for back pain.  All other systems reviewed and are negative.     Allergies  Review of patient's allergies indicates no known allergies.  Home Medications   Prior to Admission medications   Medication Sig Start Date End Date Taking? Authorizing Provider  DULoxetine (CYMBALTA) 20 MG capsule Take 20 mg by mouth every other day.    Historical Provider, MD  enoxaparin (LOVENOX) 30 MG/0.3ML injection Inject 0.3 mLs (30 mg total) into the skin every 12 (twelve) hours. 11/14/13   Johnny Bridge, MD  methocarbamol (ROBAXIN) 500 MG tablet Take 1 tablet (500 mg total) by mouth 4 (four) times daily. 11/14/13   Johnny Bridge, MD  Multiple Vitamins-Minerals (MULTIVITAMIN WITH MINERALS) tablet Take 1 tablet by mouth daily.    Historical Provider, MD  oxyCODONE-acetaminophen (PERCOCET) 10-325 MG per tablet Take 1-2 tablets by mouth every 6 (six) hours as needed for pain. MAXIMUM TOTAL ACETAMINOPHEN DOSE IS 4000 MG PER DAY 11/14/13   Johnny Bridge, MD  promethazine (PHENERGAN) 25 MG tablet Take 1 tablet (25 mg total) by mouth every 6 (six) hours as needed for nausea or vomiting. 11/14/13   Johnny Bridge, MD   BP 131/79  Pulse 82  Temp(Src) 98.5 F (36.9 C) (Oral)  Resp 16  SpO2 94% Physical Exam  Nursing note and vitals reviewed. Constitutional: He is oriented to person, place, and time. He appears well-developed and well-nourished. No distress.  HENT:  Head: Normocephalic and atraumatic.  Mouth/Throat: No oropharyngeal exudate.  Eyes: EOM are normal. Pupils are equal, round, and reactive to light.  Neck: Normal range of motion. Neck supple.  Cardiovascular: Normal rate and regular rhythm.  Exam reveals no friction rub.   No murmur heard. Pulmonary/Chest: Effort normal and breath sounds normal. No respiratory distress. He has no wheezes. He has no rales.  Abdominal: He exhibits no distension. There  is no tenderness. There is no rebound.  Musculoskeletal: Normal range of motion. He exhibits no edema.  Neurological: He is alert and oriented to person, place, and time.  Skin: He is not diaphoretic.    ED Course  Procedures (including critical care time) Labs Review Labs Reviewed  COMPREHENSIVE METABOLIC PANEL - Abnormal; Notable for the following:    Glucose, Bld 109 (*)    Anion gap 17 (*)    All other components within normal limits  CBC  I-STAT TROPOININ, ED    Imaging Review Dg Chest 2 View  06/27/2014   CLINICAL DATA:  Chest and left arm pain  EXAM: CHEST  2 VIEW  COMPARISON:  05/29/2008  FINDINGS: The lungs are clear without focal infiltrate, edema, pneumothorax or pleural effusion. The cardiopericardial silhouette is within normal limits for size. Spinal stimulator device noted in the mid thoracic spine. Imaged bony structures of the thorax are intact.  IMPRESSION: Stable.  No acute findings.   Electronically Signed   By: Misty Stanley M.D.   On: 06/27/2014 13:04     EKG Interpretation   Date/Time:  Wednesday June 27 2014 12:08:16 EDT Ventricular Rate:  98 PR Interval:  152 QRS Duration: 106 QT Interval:  364 QTC Calculation: 464 R Axis:   -141 Text Interpretation:  Normal sinus rhythm Right superior axis deviation  Abnormal ECG Similar to prior Confirmed by Mingo Amber  MD, [place] ([id number]) on  06/27/2014 1:10:02 PM      MDM   Final diagnoses:  Chest pain, unspecified chest pain type    53M here with R sided chest pain for about 2 hours this morning. Intermittent during the 2 hour period. CP free now. Low risk heart score - 3.  Patient here with normal exam, normal EKG. Plan for serial troponins.  Serial troponins normal, recommended f/u with PCP for outpatient stress.  Evelina Bucy, MD 06/27/14 [phone]

## 2014-06-27 NOTE — Discharge Instructions (Signed)

## 2014-06-27 NOTE — ED Notes (Signed)
Rt sided chest pain since 6 am  No n/v/sob just tired he states pain did not radiate

## 2014-10-17 ENCOUNTER — Ambulatory Visit (INDEPENDENT_AMBULATORY_CARE_PROVIDER_SITE_OTHER): Payer: 59 | Admitting: Physician Assistant

## 2014-10-17 VITALS — BP 144/90 | HR 86 | Temp 97.8°F | Resp 16 | Ht 77.0 in | Wt 214.2 lb

## 2014-10-17 DIAGNOSIS — T148 Other injury of unspecified body region: Secondary | ICD-10-CM

## 2014-10-17 DIAGNOSIS — T148XXA Other injury of unspecified body region, initial encounter: Secondary | ICD-10-CM

## 2014-10-17 MED ORDER — HYDROCODONE-ACETAMINOPHEN 5-325 MG PO TABS: 1.0000 | ORAL_TABLET | Freq: Four times a day (QID) | ORAL | Status: DC | PRN

## 2014-10-17 MED ORDER — CYCLOBENZAPRINE HCL 10 MG PO TABS: 10.0000 mg | ORAL_TABLET | Freq: Three times a day (TID) | ORAL | Status: DC | PRN

## 2014-10-17 MED ORDER — PREDNISONE 20 MG PO TABS: ORAL_TABLET | ORAL | Status: AC

## 2014-10-17 NOTE — Patient Instructions (Signed)
A referral for neurosurgery has been placed.  This appears to be a muscle strain/spasm. Take the Norco with food  Low Back Strain with Rehab A strain is an injury in which a tendon or muscle is torn. The muscles and tendons of the lower back are vulnerable to strains. However, these muscles and tendons are very strong and require a great force to be injured. Strains are classified into three categories. Grade 1 strains cause pain, but the tendon is not lengthened. Grade 2 strains include a lengthened ligament, due to the ligament being stretched or partially ruptured. With grade 2 strains there is still function, although the function may be decreased. Grade 3 strains involve a complete tear of the tendon or muscle, and function is usually impaired. SYMPTOMS   Pain in the lower back.  Pain that affects one side more than the other.  Pain that gets worse with movement and may be felt in the hip, buttocks, or back of the thigh.  Muscle spasms of the muscles in the back.  Swelling along the muscles of the back.  Loss of strength of the back muscles.  Crackling sound (crepitation) when the muscles are touched. CAUSES  Lower back strains occur when a force is placed on the muscles or tendons that is greater than they can handle. Common causes of injury include:  Prolonged overuse of the muscle-tendon units in the lower back, usually from incorrect posture.  A single violent injury or force applied to the back. RISK INCREASES WITH:  Sports that involve twisting forces on the spine or a lot of bending at the waist (football, rugby, weightlifting, bowling, golf, tennis, speed skating, racquetball, swimming, running, gymnastics, diving).  Poor strength and flexibility.  Failure to warm up properly before activity.  Family history of lower back pain or disk disorders.  Previous back injury or surgery (especially fusion).  Poor posture with lifting, especially heavy objects.  Prolonged  sitting, especially with poor posture. PREVENTION   Learn and use proper posture when sitting or lifting (maintain proper posture when sitting, lift using the knees and legs, not at the waist).  Warm up and stretch properly before activity.  Allow for adequate recovery between workouts.  Maintain physical fitness:  Strength, flexibility, and endurance.  Cardiovascular fitness. PROGNOSIS  If treated properly, lower back strains usually heal within 6 weeks. RELATED COMPLICATIONS   Recurring symptoms, resulting in a chronic problem.  Chronic inflammation, scarring, and partial muscle-tendon tear.  Delayed healing or resolution of symptoms.  Prolonged disability. TREATMENT  Treatment first involves the use of ice and medicine, to reduce pain and inflammation. The use of strengthening and stretching exercises may help reduce pain with activity. These exercises may be performed at home or with a therapist. Severe injuries may require referral to a therapist for further evaluation and treatment, such as ultrasound. Your caregiver may advise that you wear a back brace or corset, to help reduce pain and discomfort. Often, prolonged bed rest results in greater harm then benefit. Corticosteroid injections may be recommended. However, these should be reserved for the most serious cases. It is important to avoid using your back when lifting objects. At night, sleep on your back on a firm mattress with a pillow placed under your knees. If non-surgical treatment is unsuccessful, surgery may be needed.  MEDICATION   If pain medicine is needed, nonsteroidal anti-inflammatory medicines (aspirin and ibuprofen), or other minor pain relievers (acetaminophen), are often advised.  Do not take pain medicine for  7 days before surgery.  Prescription pain relievers may be given, if your caregiver thinks they are needed. Use only as directed and only as much as you need.  Ointments applied to the skin may be  helpful.  Corticosteroid injections may be given by your caregiver. These injections should be reserved for the most serious cases, because they may only be given a certain number of times. HEAT AND COLD  Cold treatment (icing) should be applied for 10 to 15 minutes every 2 to 3 hours for inflammation and pain, and immediately after activity that aggravates your symptoms. Use ice packs or an ice massage.  Heat treatment may be used before performing stretching and strengthening activities prescribed by your caregiver, physical therapist, or athletic trainer. Use a heat pack or a warm water soak. SEEK MEDICAL CARE IF:   Symptoms get worse or do not improve in 2 to 4 weeks, despite treatment.  You develop numbness, weakness, or loss of bowel or bladder function.  New, unexplained symptoms develop. (Drugs used in treatment may produce side effects.) EXERCISES  RANGE OF MOTION (ROM) AND STRETCHING EXERCISES - Low Back Strain Most people with lower back pain will find that their symptoms get worse with excessive bending forward (flexion) or arching at the lower back (extension). The exercises which will help resolve your symptoms will focus on the opposite motion.  Your physician, physical therapist or athletic trainer will help you determine which exercises will be most helpful to resolve your lower back pain. Do not complete any exercises without first consulting with your caregiver. Discontinue any exercises which make your symptoms worse until you speak to your caregiver.  If you have pain, numbness or tingling which travels down into your buttocks, leg or foot, the goal of the therapy is for these symptoms to move closer to your back and eventually resolve. Sometimes, these leg symptoms will get better, but your lower back pain may worsen. This is typically an indication of progress in your rehabilitation. Be very alert to any changes in your symptoms and the activities in which you participated  in the 24 hours prior to the change. Sharing this information with your caregiver will allow him/her to most efficiently treat your condition.  These exercises may help you when beginning to rehabilitate your injury. Your symptoms may resolve with or without further involvement from your physician, physical therapist or athletic trainer. While completing these exercises, remember:  Restoring tissue flexibility helps normal motion to return to the joints. This allows healthier, less painful movement and activity.  An effective stretch should be held for at least 30 seconds.  A stretch should never be painful. You should only feel a gentle lengthening or release in the stretched tissue. FLEXION RANGE OF MOTION AND STRETCHING EXERCISES: STRETCH - Flexion, Single Knee to Chest   Lie on a firm bed or floor with both legs extended in front of you.  Keeping one leg in contact with the floor, bring your opposite knee to your chest. Hold your leg in place by either grabbing behind your thigh or at your knee.  Pull until you feel a gentle stretch in your lower back. Hold __________ seconds.  Slowly release your grasp and repeat the exercise with the opposite side. Repeat __________ times. Complete this exercise __________ times per day.  STRETCH - Flexion, Double Knee to Chest   Lie on a firm bed or floor with both legs extended in front of you.  Keeping one leg in contact  with the floor, bring your opposite knee to your chest.  Tense your stomach muscles to support your back and then lift your other knee to your chest. Hold your legs in place by either grabbing behind your thighs or at your knees.  Pull both knees toward your chest until you feel a gentle stretch in your lower back. Hold __________ seconds.  Tense your stomach muscles and slowly return one leg at a time to the floor. Repeat __________ times. Complete this exercise __________ times per day.  STRETCH - Low Trunk Rotation  Lie  on a firm bed or floor. Keeping your legs in front of you, bend your knees so they are both pointed toward the ceiling and your feet are flat on the floor.  Extend your arms out to the side. This will stabilize your upper body by keeping your shoulders in contact with the floor.  Gently and slowly drop both knees together to one side until you feel a gentle stretch in your lower back. Hold for __________ seconds.  Tense your stomach muscles to support your lower back as you bring your knees back to the starting position. Repeat the exercise to the other side. Repeat __________ times. Complete this exercise __________ times per day  EXTENSION RANGE OF MOTION AND FLEXIBILITY EXERCISES: STRETCH - Extension, Prone on Elbows   Lie on your stomach on the floor, a bed will be too soft. Place your palms about shoulder width apart and at the height of your head.  Place your elbows under your shoulders. If this is too painful, stack pillows under your chest.  Allow your body to relax so that your hips drop lower and make contact more completely with the floor.  Hold this position for __________ seconds.  Slowly return to lying flat on the floor. Repeat __________ times. Complete this exercise __________ times per day.  RANGE OF MOTION - Extension, Prone Press Ups  Lie on your stomach on the floor, a bed will be too soft. Place your palms about shoulder width apart and at the height of your head.  Keeping your back as relaxed as possible, slowly straighten your elbows while keeping your hips on the floor. You may adjust the placement of your hands to maximize your comfort. As you gain motion, your hands will come more underneath your shoulders.  Hold this position __________ seconds.  Slowly return to lying flat on the floor. Repeat __________ times. Complete this exercise __________ times per day.  RANGE OF MOTION- Quadruped, Neutral Spine   Assume a hands and knees position on a firm surface.  Keep your hands under your shoulders and your knees under your hips. You may place padding under your knees for comfort.  Drop your head and point your tail bone toward the ground below you. This will round out your lower back like an angry cat. Hold this position for __________ seconds.  Slowly lift your head and release your tail bone so that your back sags into a large arch, like an old horse.  Hold this position for __________ seconds.  Repeat this until you feel limber in your lower back.  Now, find your "sweet spot." This will be the most comfortable position somewhere between the two previous positions. This is your neutral spine. Once you have found this position, tense your stomach muscles to support your lower back.  Hold this position for __________ seconds. Repeat __________ times. Complete this exercise __________ times per day.  STRENGTHENING EXERCISES - Low  Back Strain These exercises may help you when beginning to rehabilitate your injury. These exercises should be done near your "sweet spot." This is the neutral, low-back arch, somewhere between fully rounded and fully arched, that is your least painful position. When performed in this safe range of motion, these exercises can be used for people who have either a flexion or extension based injury. These exercises may resolve your symptoms with or without further involvement from your physician, physical therapist or athletic trainer. While completing these exercises, remember:   Muscles can gain both the endurance and the strength needed for everyday activities through controlled exercises.  Complete these exercises as instructed by your physician, physical therapist or athletic trainer. Increase the resistance and repetitions only as guided.  You may experience muscle soreness or fatigue, but the pain or discomfort you are trying to eliminate should never worsen during these exercises. If this pain does worsen, stop and make  certain you are following the directions exactly. If the pain is still present after adjustments, discontinue the exercise until you can discuss the trouble with your caregiver. STRENGTHENING - Deep Abdominals, Pelvic Tilt  Lie on a firm bed or floor. Keeping your legs in front of you, bend your knees so they are both pointed toward the ceiling and your feet are flat on the floor.  Tense your lower abdominal muscles to press your lower back into the floor. This motion will rotate your pelvis so that your tail bone is scooping upwards rather than pointing at your feet or into the floor.  With a gentle tension and even breathing, hold this position for __________ seconds. Repeat __________ times. Complete this exercise __________ times per day.  STRENGTHENING - Abdominals, Crunches   Lie on a firm bed or floor. Keeping your legs in front of you, bend your knees so they are both pointed toward the ceiling and your feet are flat on the floor. Cross your arms over your chest.  Slightly tip your chin down without bending your neck.  Tense your abdominals and slowly lift your trunk high enough to just clear your shoulder blades. Lifting higher can put excessive stress on the lower back and does not further strengthen your abdominal muscles.  Control your return to the starting position. Repeat __________ times. Complete this exercise __________ times per day.  STRENGTHENING - Quadruped, Opposite UE/LE Lift   Assume a hands and knees position on a firm surface. Keep your hands under your shoulders and your knees under your hips. You may place padding under your knees for comfort.  Find your neutral spine and gently tense your abdominal muscles so that you can maintain this position. Your shoulders and hips should form a rectangle that is parallel with the floor and is not twisted.  Keeping your trunk steady, lift your right hand no higher than your shoulder and then your left leg no higher than your  hip. Make sure you are not holding your breath. Hold this position __________ seconds.  Continuing to keep your abdominal muscles tense and your back steady, slowly return to your starting position. Repeat with the opposite arm and leg. Repeat __________ times. Complete this exercise __________ times per day.  STRENGTHENING - Lower Abdominals, Double Knee Lift  Lie on a firm bed or floor. Keeping your legs in front of you, bend your knees so they are both pointed toward the ceiling and your feet are flat on the floor.  Tense your abdominal muscles to brace your lower  back and slowly lift both of your knees until they come over your hips. Be certain not to hold your breath.  Hold __________ seconds. Using your abdominal muscles, return to the starting position in a slow and controlled manner. Repeat __________ times. Complete this exercise __________ times per day.  POSTURE AND BODY MECHANICS CONSIDERATIONS - Low Back Strain Keeping correct posture when sitting, standing or completing your activities will reduce the stress put on different body tissues, allowing injured tissues a chance to heal and limiting painful experiences. The following are general guidelines for improved posture. Your physician or physical therapist will provide you with any instructions specific to your needs. While reading these guidelines, remember:  The exercises prescribed by your provider will help you have the flexibility and strength to maintain correct postures.  The correct posture provides the best environment for your joints to work. All of your joints have less wear and tear when properly supported by a spine with good posture. This means you will experience a healthier, less painful body.  Correct posture must be practiced with all of your activities, especially prolonged sitting and standing. Correct posture is as important when doing repetitive low-stress activities (typing) as it is when doing a single  heavy-load activity (lifting). RESTING POSITIONS Consider which positions are most painful for you when choosing a resting position. If you have pain with flexion-based activities (sitting, bending, stooping, squatting), choose a position that allows you to rest in a less flexed posture. You would want to avoid curling into a fetal position on your side. If your pain worsens with extension-based activities (prolonged standing, working overhead), avoid resting in an extended position such as sleeping on your stomach. Most people will find more comfort when they rest with their spine in a more neutral position, neither too rounded nor too arched. Lying on a non-sagging bed on your side with a pillow between your knees, or on your back with a pillow under your knees will often provide some relief. Keep in mind, being in any one position for a prolonged period of time, no matter how correct your posture, can still lead to stiffness. PROPER SITTING POSTURE In order to minimize stress and discomfort on your spine, you must sit with correct posture. Sitting with good posture should be effortless for a healthy body. Returning to good posture is a gradual process. Many people can work toward this most comfortably by using various supports until they have the flexibility and strength to maintain this posture on their own. When sitting with proper posture, your ears will fall over your shoulders and your shoulders will fall over your hips. You should use the back of the chair to support your upper back. Your lower back will be in a neutral position, just slightly arched. You may place a small pillow or folded towel at the base of your lower back for support.  When working at a desk, create an environment that supports good, upright posture. Without extra support, muscles tire, which leads to excessive strain on joints and other tissues. Keep these recommendations in mind: CHAIR:  A chair should be able to slide under  your desk when your back makes contact with the back of the chair. This allows you to work closely.  The chair's height should allow your eyes to be level with the upper part of your monitor and your hands to be slightly lower than your elbows. BODY POSITION  Your feet should make contact with the floor. If this  is not possible, use a foot rest.  Keep your ears over your shoulders. This will reduce stress on your neck and lower back. INCORRECT SITTING POSTURES  If you are feeling tired and unable to assume a healthy sitting posture, do not slouch or slump. This puts excessive strain on your back tissues, causing more damage and pain. Healthier options include:  Using more support, like a lumbar pillow.  Switching tasks to something that requires you to be upright or walking.  Talking a brief walk.  Lying down to rest in a neutral-spine position. PROLONGED STANDING WHILE SLIGHTLY LEANING FORWARD  When completing a task that requires you to lean forward while standing in one place for a long time, place either foot up on a stationary 2-4 inch high object to help maintain the best posture. When both feet are on the ground, the lower back tends to lose its slight inward curve. If this curve flattens (or becomes too large), then the back and your other joints will experience too much stress, tire more quickly, and can cause pain. CORRECT STANDING POSTURES Proper standing posture should be assumed with all daily activities, even if they only take a few moments, like when brushing your teeth. As in sitting, your ears should fall over your shoulders and your shoulders should fall over your hips. You should keep a slight tension in your abdominal muscles to brace your spine. Your tailbone should point down to the ground, not behind your body, resulting in an over-extended swayback posture.  INCORRECT STANDING POSTURES  Common incorrect standing postures include a forward head, locked knees and/or an  excessive swayback. WALKING Walk with an upright posture. Your ears, shoulders and hips should all line-up. PROLONGED ACTIVITY IN A FLEXED POSITION When completing a task that requires you to bend forward at your waist or lean over a low surface, try to find a way to stabilize 3 out of 4 of your limbs. You can place a hand or elbow on your thigh or rest a knee on the surface you are reaching across. This will provide you more stability so that your muscles do not fatigue as quickly. By keeping your knees relaxed, or slightly bent, you will also reduce stress across your lower back. CORRECT LIFTING TECHNIQUES DO :   Assume a wide stance. This will provide you more stability and the opportunity to get as close as possible to the object which you are lifting.  Tense your abdominals to brace your spine. Bend at the knees and hips. Keeping your back locked in a neutral-spine position, lift using your leg muscles. Lift with your legs, keeping your back straight.  Test the weight of unknown objects before attempting to lift them.  Try to keep your elbows locked down at your sides in order get the best strength from your shoulders when carrying an object.  Always ask for help when lifting heavy or awkward objects. INCORRECT LIFTING TECHNIQUES DO NOT:   Lock your knees when lifting, even if it is a small object.  Bend and twist. Pivot at your feet or move your feet when needing to change directions.  Assume that you can safely pick up even a paper clip without proper posture. Document Released: 09/14/2005 Document Revised: 12/07/2011 Document Reviewed: 12/27/2008 [facility] Patient Information 2015 [place], [place]. This information is not intended to replace advice given to you by your health care provider. Make sure you discuss any questions you have with your health care provider.

## 2014-10-17 NOTE — Progress Notes (Signed)
    MRN: [id number] DOB: Feb 21, 1961  Subjective:   Chief Complaint  Patient presents with  . Back Pain    Alan Kennedy is a 54 y.o. male with pmh of multiple back surgeries and a spinal cord stimulator implant, presenting for back pain that has existed for years but progressively worsened over the last 3 days after he was sawing a tree.  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  He was a tree blocking the road, when he felt a sharp pain at his right lower back just proximal to his buttocks.  He dropped the saw and fell to the ground.  The pain is sharp and constant.  Nothing appears to relieve the pain.  She has tried tylenol and ibuprofen to no avail.  He states that nothing aggravates it further.  He denies fever, weakness, numbness or tingling in the legs, and denies incontinence.  Patient is followed with his back by orthopedist, Dr. Rolena Infante.  Patient reports that he has been controlling his pain with just tylenol and ibuprofen, but lives chronically with pain outside of this recent injury.     Burr has a current medication list which includes the following prescription(s): acetaminophen, ibuprofen, and multivitamin with minerals.  He has No Known Allergies.  Vikram  has a past medical history of Depression; Arthritis; Osteoarthritis of left knee, medial (11/14/2013); and Gout. Also  has past surgical history that includes Knee arthroscopy; Hand reconstruction (Left); Shoulder surgery (Right); Spinal cord stimulator implant (2009); Back surgery; Nose surgery (2000); Knee surgery (Left, 11/14/2013); and Partial knee arthroplasty (Left, 11/14/2013).  ROS As in subjective.  Objective:   Vitals: BP 144/90 mmHg  Pulse 86  Temp(Src) 97.8 F (36.6 C) (Oral)  Resp 16  Ht 6\' 5"  (1.956 m)  Wt 214 lb 3.2 oz (97.16 kg)  BMI 25.40 kg/m2  SpO2 98%  Physical Exam  Constitutional: He is oriented to person, place, and time and well-developed,  well-nourished, and in no distress. No distress.  HENT:  Head: Normocephalic and atraumatic.  Eyes: Conjunctivae are normal. Pupils are equal, round, and reactive to light.  Cardiovascular: Normal rate.   Pulmonary/Chest: Effort normal and breath sounds normal. No respiratory distress.  Musculoskeletal:  There appears to be no erythema at lower back.  No bony tenderness along the spine.  Tenderness just lateral to the lower lumbar L4-L5 on the right side.  Muscle spasm appreciated.  Pain with straight leg raise bilaterally at the lower right lumbar.  Widened gait.  Pain with right sided lateral deviation and torso rotation.  30 degrees forward flexion.  Weakened plantar flexion.    Neurological: He is alert and oriented to person, place, and time.  Psychiatric: Mood and affect normal.     Assessment and Plan :  54 year old male is here today for a chief complaint of lower back pain.  This appears to be muscle strain with possible sciatic involvement, however there is considerable pain with other movements.  Neurosurgery consult appreciated at this time for further testing and possible imaging modalities with this neuromodulator implant.  Muscle strain - Plan: predniSONE (DELTASONE) 20 MG tablet, cyclobenzaprine (FLEXERIL) 10 MG tablet, Ambulatory referral to Neurosurgery, HYDROcodone-acetaminophen (No Name) 5-325 MG per tablet  Ivar Drape, PA-C [facility] 1/21/201611:39 PM

## 2014-10-31 ENCOUNTER — Other Ambulatory Visit (HOSPITAL_COMMUNITY): Payer: Self-pay | Admitting: Neurosurgery

## 2014-10-31 DIAGNOSIS — M545 Low back pain: Secondary | ICD-10-CM

## 2014-11-01 ENCOUNTER — Encounter (HOSPITAL_COMMUNITY): Payer: Self-pay | Admitting: Pharmacy Technician

## 2014-11-06 ENCOUNTER — Ambulatory Visit
Admission: RE | Admit: 2014-11-06 | Discharge: 2014-11-06 | Disposition: A | Discharge status: Home / Self Care | Payer: 59 | Source: Ambulatory Visit | Attending: Family Medicine | Admitting: Family Medicine

## 2014-11-06 ENCOUNTER — Other Ambulatory Visit: Payer: Self-pay | Admitting: Family Medicine

## 2014-11-06 DIAGNOSIS — R112 Nausea with vomiting, unspecified: Secondary | ICD-10-CM

## 2014-11-06 DIAGNOSIS — R110 Nausea: Secondary | ICD-10-CM

## 2014-11-06 DIAGNOSIS — R52 Pain, unspecified: Secondary | ICD-10-CM

## 2014-11-07 ENCOUNTER — Ambulatory Visit (HOSPITAL_COMMUNITY)
Admission: RE | Admit: 2014-11-07 | Discharge: 2014-11-07 | Disposition: A | Discharge status: Home / Self Care | Payer: 59 | Source: Ambulatory Visit | Attending: Neurosurgery | Admitting: Neurosurgery

## 2014-11-07 DIAGNOSIS — M5430 Sciatica, unspecified side: Secondary | ICD-10-CM | POA: Insufficient documentation

## 2014-11-07 DIAGNOSIS — M545 Low back pain: Secondary | ICD-10-CM

## 2014-11-07 MED ORDER — OXYCODONE HCL 5 MG PO TABS
ORAL_TABLET | ORAL | Status: AC
  Filled 2014-11-07: qty 2

## 2014-11-07 MED ORDER — IOHEXOL 180 MG/ML  SOLN
20.0000 mL | Freq: Once | INTRAMUSCULAR | Status: AC | PRN
  Administered 2014-11-07: 16 mL via INTRATHECAL

## 2014-11-07 MED ORDER — DIAZEPAM 5 MG PO TABS
10.0000 mg | ORAL_TABLET | Freq: Once | ORAL | Status: AC
  Administered 2014-11-07: 10 mg via ORAL

## 2014-11-07 MED ORDER — DIAZEPAM 5 MG PO TABS
ORAL_TABLET | ORAL | Status: AC
  Administered 2014-11-07: 10 mg via ORAL
  Filled 2014-11-07: qty 2

## 2014-11-07 MED ORDER — OXYCODONE HCL 5 MG PO TABS
5.0000 mg | ORAL_TABLET | ORAL | Status: DC | PRN
  Administered 2014-11-07: 10 mg via ORAL

## 2014-11-07 MED ORDER — ONDANSETRON HCL 4 MG/2ML IJ SOLN: 4.0000 mg | Freq: Four times a day (QID) | INTRAMUSCULAR | Status: DC | PRN

## 2014-11-07 NOTE — Discharge Instructions (Signed)
Myelography, Care After °These instructions give you information on caring for yourself after your procedure. Your doctor may also give you specific instructions. Call your doctor if you have any problems or questions after your procedure. °HOME CARE °· Rest often the first day. °· When you rest, lie flat, with your head slightly raised (elevated). °· Avoid heavy lifting and activity for 48 hours. °· You may take the bandage (dressing) off 1 day after the test. °GET HELP RIGHT AWAY IF:  °· You have a very bad headache. °· You have a fever. °MAKE SURE YOU: °· Understand these instructions. °· Will watch your condition. °· Will get help right away if you are not doing well or get worse. °Document Released: 06/23/2008 Document Revised: 01/29/2014 Document Reviewed: 01/03/2014 °ExitCare® Patient Information ©2015 [employer]. This information is not intended to replace advice given to you by your health care provider. Make sure you discuss any questions you have with your health care provider. ° ° °

## 2014-11-07 NOTE — Op Note (Signed)
11/07/2014 Lumbar Myelogram  PATIENT:  Alan Kennedy is a 54 y.o. male with a long history of lower back pain.   PRE-OPERATIVE DIAGNOSIS:  Lumbago with sciatica  POST-OPERATIVE DIAGNOSIS:  same  PROCEDURE:  Lumbar Myelogram  SURGEON:  Joline Encalada  ANESTHESIA:   local LOCAL MEDICATIONS USED:  LIDOCAINE  and Amount: 5 ml Procedure Note: Alan Kennedy is a 54 y.o. male Was taken to the fluoroscopy suite and  positioned prone on the fluoroscopy table. His back was prepared and draped in a sterile manner. I infiltrated 5 cc lidocaine into the lumbar region. I then introduced a spinal needle into the thecal sac at the L3/4 interlaminar space. I infiltrated 20cc of Omnipaque 180 into the thecal sac. Fluoroscopy showed the needle and contrast in the thecal sac. Alan Kennedy tolerated the procedure well. he Will be taken to CT for evaluation.     PATIENT DISPOSITION:  Short Stay

## 2014-12-04 ENCOUNTER — Ambulatory Visit (INDEPENDENT_AMBULATORY_CARE_PROVIDER_SITE_OTHER): Payer: 59 | Admitting: Physician Assistant

## 2014-12-04 VITALS — BP 130/90 | HR 86 | Temp 97.9°F | Resp 18 | Wt 210.0 lb

## 2014-12-04 DIAGNOSIS — H9201 Otalgia, right ear: Secondary | ICD-10-CM

## 2014-12-04 DIAGNOSIS — R112 Nausea with vomiting, unspecified: Secondary | ICD-10-CM

## 2014-12-04 LAB — POCT URINALYSIS DIPSTICK
Bilirubin, UA: NEGATIVE
Blood, UA: NEGATIVE
GLUCOSE UA: NEGATIVE
Ketones, UA: NEGATIVE
LEUKOCYTES UA: NEGATIVE
Nitrite, UA: NEGATIVE
PROTEIN UA: NEGATIVE
Spec Grav, UA: 1.015
Urobilinogen, UA: 0.2
pH, UA: 6

## 2014-12-04 LAB — POCT UA - MICROSCOPIC ONLY
Bacteria, U Microscopic: NEGATIVE
CASTS, UR, LPF, POC: NEGATIVE
Crystals, Ur, HPF, POC: NEGATIVE
YEAST UA: NEGATIVE

## 2014-12-04 LAB — POCT CBC
Granulocyte percent: 68.2 %G (ref 37–80)
HCT, POC: 47.2 % (ref 43.5–53.7)
Hemoglobin: 15.8 g/dL (ref 14.1–18.1)
Lymph, poc: 2.2 (ref 0.6–3.4)
MCH, POC: 29.4 pg (ref 27–31.2)
MCHC: 33.6 g/dL (ref 31.8–35.4)
MCV: 87.6 fL (ref 80–97)
MID (CBC): 0.6 (ref 0–0.9)
MPV: 7 fL (ref 0–99.8)
PLATELET COUNT, POC: 391 10*3/uL (ref 142–424)
POC Granulocyte: 5.8 (ref 2–6.9)
POC LYMPH %: 25.3 % (ref 10–50)
POC MID %: 6.5 %M (ref 0–12)
RBC: 5.39 M/uL (ref 4.69–6.13)
RDW, POC: 12.6 %
WBC: 8.5 10*3/uL (ref 4.6–10.2)

## 2014-12-04 LAB — COMPLETE METABOLIC PANEL WITH GFR
ALK PHOS: 82 U/L (ref 39–117)
ALT: 22 U/L (ref 0–53)
AST: 23 U/L (ref 0–37)
Albumin: 5 g/dL (ref 3.5–5.2)
BILIRUBIN TOTAL: 0.6 mg/dL (ref 0.2–1.2)
BUN: 13 mg/dL (ref 6–23)
CO2: 28 mEq/L (ref 19–32)
CREATININE: 0.94 mg/dL (ref 0.50–1.35)
Calcium: 10.1 mg/dL (ref 8.4–10.5)
Chloride: 100 mEq/L (ref 96–112)
GFR, Est African American: >89 mL/min
Glucose, Bld: 100 mg/dL — ABNORMAL HIGH (ref 70–99)
POTASSIUM: 4.7 meq/L (ref 3.5–5.3)
Sodium: 138 mEq/L (ref 135–145)
Total Protein: 8 g/dL (ref 6.0–8.3)

## 2014-12-04 MED ORDER — ONDANSETRON 8 MG PO TBDP: 8.0000 mg | ORAL_TABLET | Freq: Three times a day (TID) | ORAL | Status: AC | PRN

## 2014-12-04 NOTE — Progress Notes (Signed)
[facility] [address], [place] [address] [phone]  Date:  12/04/2014   Name:  Alan Kennedy   DOB:  [birth year]   MRN:  [id number]  PCP:  Shirline Frees, MD    Chief Complaint: Nausea; Abdominal Pain; and Otalgia   History of Present Illness:  Alan Kennedy is a 54 y.o. very pleasant male patient who presents with the following:  He complains of right ear pain for 3 days, and nausea that started at the same time.  Patient claims that he has been vomiting non-bilious, non bloody food.  He denies any dizziness.  He denies diarrhea but does have soft stool.  He denies heartburn, abdominal pain, fever, sob, dyspnea, or cough.  His ear pain is not associated with any ear fullness, drainage, nasal congestion, or sinus pressure.  He states it hurts when he puts his finger in his ear.  He has no rash, no facial weakness, numbness or tingling. Patient has been using opiate/nsaid for the last three weeks following multiple back surgeries.  He has been attempting to take the medication with food, but it has not been working.        Patient Active Problem List   Diagnosis Date Noted  . Osteoarthritis of left knee, medial 11/14/2013  . Knee osteoarthritis 11/14/2013  . MEDIAL MENISCUS TEAR, LEFT 06/23/2010  . KNEE INJURY, LEFT 06/23/2010    Past Medical History  Diagnosis Date  . Depression   . Arthritis     knees  . Osteoarthritis of left knee, medial 11/14/2013  . Gout     Past Surgical History  Procedure Laterality Date  . Knee arthroscopy      30% cartiledge removal, 2x's on the R, multiple times on the L   . Hand reconstruction Left   . Shoulder surgery Right     arthroscopy  . Spinal cord stimulator implant  2009  . Back surgery      x3  . Nose surgery  2000    reconstruction   . Knee surgery Left 11/14/2013    DR LANDAU            UNICOMPARTMENTAL   . Partial knee arthroplasty Left 11/14/2013    Procedure: UNICOMPARTMENTAL LEFT KNEE;   Surgeon: Johnny Bridge, MD;  Location: [facility];  Service: Orthopedics;  Laterality: Left;    History  Substance Use Topics  . Smoking status: Never Smoker   . Smokeless tobacco: Never Used  . Alcohol Use: No    History reviewed. No pertinent family history.  No Known Allergies  Medication list has been reviewed and updated.  Current Outpatient Prescriptions on File Prior to Visit  Medication Sig Dispense Refill  . Multiple Vitamins-Minerals (MULTIVITAMIN WITH MINERALS) tablet Take 1 tablet by mouth daily.     No current facility-administered medications on file prior to visit.    Review of Systems: ROS otherwise unremarkable unless listed above.    Physical Examination: Filed Vitals:   12/04/14 1014  BP: 130/90  Pulse: 86  Temp: 97.9 F (36.6 C)  Resp: 18   Filed Vitals:   12/04/14 1014  Weight: 210 lb (95.255 kg)   Body mass index is 26.25 kg/(m^2). Ideal Body Weight:    Physical Exam  Constitutional: He is oriented to person, place, and time. He appears well-developed and well-nourished. No distress.  HENT:  Head: Normocephalic and atraumatic.  Right Ear: Tympanic membrane, external ear and ear canal normal.  Left Ear: Tympanic membrane, external ear and ear canal normal.  Nose: Nose normal. Right sinus exhibits no maxillary sinus tenderness and no frontal sinus tenderness. Left sinus exhibits no maxillary sinus tenderness and no frontal sinus tenderness.  Mouth/Throat: Oropharynx is clear and moist. No oropharyngeal exudate.  Eyes: Conjunctivae and EOM are normal. Pupils are equal, round, and reactive to light. Right eye exhibits no discharge. Left eye exhibits no discharge.  Cardiovascular: Normal rate, regular rhythm and normal heart sounds.  Exam reveals no friction rub.   No murmur heard. Pulmonary/Chest: Effort normal and breath sounds normal. No respiratory distress. He has no wheezes.  Lymphadenopathy:       Head (right side): No submandibular, no  tonsillar, no preauricular, no posterior auricular and no occipital adenopathy present.       Head (left side): No submandibular, no tonsillar, no preauricular, no posterior auricular and no occipital adenopathy present.    He has no cervical adenopathy.    He has no axillary adenopathy.       Right: No supraclavicular adenopathy present.       Left: No supraclavicular adenopathy present.  Neurological: He is alert and oriented to person, place, and time.  Skin: Skin is warm and dry.  Psychiatric: He has a normal mood and affect. His behavior is normal.    Assessment and Plan: 54 year old male is here today for chief complaint of nausea and ear pain.  This is likely patient not tolerating narcotic well.  Advised patient to not manipulate his right ear, and to return if he has swelling, redness, etc.  The nausea is most suspicious for medication induced nausea.  i advised patient to take with a meal and full glass of water.  I am ordering ondansetron.  Advised that if symptoms do not resolve within the next 7 days, if sxs do not resolve  Nausea and vomiting, vomiting of unspecified type - Plan: POCT UA - Microscopic Only, POCT urinalysis dipstick, POCT CBC, COMPLETE METABOLIC PANEL WITH GFR, ondansetron (ZOFRAN-ODT) 8 MG disintegrating tablet  Ear pain, right.  Ivar Drape, PA-C [facility] 3/8/201612:08 PM

## 2014-12-04 NOTE — Patient Instructions (Signed)
Eat before taking any type of medication for pain relief.  Drink a full glass of water with swallowing the pill for pain relief. Use the zofran when you have nausea every 8 hours as needed.   I will give you your lab results in the next 10 days.   You can also try over the counter prilosec 20mg  once per day by mouth for 2 weeks if zofran, and eating are not helping.     Acetaminophen; Hydrocodone tablets or capsules What is this medicine? ACETAMINOPHEN; HYDROCODONE (a set a MEE noe fen; hye droe KOE done) is a pain reliever. It is used to treat mild to moderate pain. This medicine may be used for other purposes; ask your health care provider or pharmacist if you have questions. COMMON BRAND NAME(S): Anexsia, Bancap HC, Ceta-Plus, Co-Gesic, Comfortpak, Dolagesic, [employer], DuoCet, Hydrocet, Hydrogesic, [place], Lorcet HD, Lorcet Plus, Lortab, Margesic H, Maxidone, [place], Polygesic, [place], [place], [employer] [employer], Vicodin, Vicodin ES, Vicodin HP, Charlane Ferretti What should I tell my health care provider before I take this medicine? They need to know if you have any of these conditions: -brain tumor -Crohn's disease, inflammatory bowel disease, or ulcerative colitis -drug abuse or addiction -head injury -heart or circulation problems -if you often drink alcohol -kidney disease or problems going to the bathroom -liver disease -lung disease, asthma, or breathing problems -an unusual or allergic reaction to acetaminophen, hydrocodone, other opioid analgesics, other medicines, foods, dyes, or preservatives -pregnant or trying to get pregnant -breast-feeding How should I use this medicine? Take this medicine by mouth. Swallow it with a full glass of water. Follow the directions on the prescription label. If the medicine upsets your stomach, take the medicine with food or milk. Do not take more than you are told to take. Talk to your pediatrician regarding the use of this medicine in children. This  medicine is not approved for use in children. Overdosage: If you think you have taken too much of this medicine contact a poison control center or emergency room at once. NOTE: This medicine is only for you. Do not share this medicine with others. What if I miss a dose? If you miss a dose, take it as soon as you can. If it is almost time for your next dose, take only that dose. Do not take double or extra doses. What may interact with this medicine? -alcohol -antihistamines -isoniazid -medicines for depression, anxiety, or psychotic disturbances -medicines for sleep -muscle relaxants -naltrexone -narcotic medicines (opiates) for pain -phenobarbital -ritonavir -tramadol This list may not describe all possible interactions. Give your health care provider a list of all the medicines, herbs, non-prescription drugs, or dietary supplements you use. Also tell them if you smoke, drink alcohol, or use illegal drugs. Some items may interact with your medicine. What should I watch for while using this medicine? Tell your doctor or health care professional if your pain does not go away, if it gets worse, or if you have new or a different type of pain. You may develop tolerance to the medicine. Tolerance means that you will need a higher dose of the medicine for pain relief. Tolerance is normal and is expected if you take the medicine for a long time. Do not suddenly stop taking your medicine because you may develop a severe reaction. Your body becomes used to the medicine. This does NOT mean you are addicted. Addiction is a behavior related to getting and using a drug for a non-medical reason. If you have  pain, you have a medical reason to take pain medicine. Your doctor will tell you how much medicine to take. If your doctor wants you to stop the medicine, the dose will be slowly lowered over time to avoid any side effects. You may get drowsy or dizzy when you first start taking the medicine or change  doses. Do not drive, use machinery, or do anything that may be dangerous until you know how the medicine affects you. Stand or sit up slowly. There are different types of narcotic medicines (opiates) for pain. If you take more than one type at the same time, you may have more side effects. Give your health care provider a list of all medicines you use. Your doctor will tell you how much medicine to take. Do not take more medicine than directed. Call emergency for help if you have problems breathing. The medicine will cause constipation. Try to have a bowel movement at least every 2 to 3 days. If you do not have a bowel movement for 3 days, call your doctor or health care professional. Too much acetaminophen can be very dangerous. Do not take Tylenol (acetaminophen) or medicines that contain acetaminophen with this medicine. Many non-prescription medicines contain acetaminophen. Always read the labels carefully. What side effects may I notice from receiving this medicine? Side effects that you should report to your doctor or health care professional as soon as possible: -allergic reactions like skin rash, itching or hives, swelling of the face, lips, or tongue -breathing problems -confusion -feeling faint or lightheaded, falls -stomach pain -yellowing of the eyes or skin Side effects that usually do not require medical attention (report to your doctor or health care professional if they continue or are bothersome): -nausea, vomiting -stomach upset This list may not describe all possible side effects. Call your doctor for medical advice about side effects. You may report side effects to FDA at 1-800-FDA-1088. Where should I keep my medicine? Keep out of the reach of children. This medicine can be abused. Keep your medicine in a safe place to protect it from theft. Do not share this medicine with anyone. Selling or giving away this medicine is dangerous and against the law. Store at room temperature  between 15 and 30 degrees C (59 and 86 degrees F). Protect from light. Keep container tightly closed. Throw away any unused medicine after the expiration date. Discard unused medicine and used packaging carefully. Pets and children can be harmed if they find used or lost packages. NOTE: This sheet is a summary. It may not cover all possible information. If you have questions about this medicine, talk to your doctor, pharmacist, or health care provider.  2015, Elsevier/Gold Standard. (2013-05-08 13:15:56)

## 2015-05-20 ENCOUNTER — Ambulatory Visit (INDEPENDENT_AMBULATORY_CARE_PROVIDER_SITE_OTHER): Payer: Commercial Managed Care - HMO

## 2015-05-20 ENCOUNTER — Ambulatory Visit (INDEPENDENT_AMBULATORY_CARE_PROVIDER_SITE_OTHER): Payer: Commercial Managed Care - HMO | Admitting: Emergency Medicine

## 2015-05-20 VITALS — BP 150/70 | HR 90 | Temp 98.3°F | Resp 16 | Ht 75.0 in | Wt 211.0 lb

## 2015-05-20 DIAGNOSIS — S9781XA Crushing injury of right foot, initial encounter: Secondary | ICD-10-CM | POA: Diagnosis not present

## 2015-05-20 DIAGNOSIS — S9781XD Crushing injury of right foot, subsequent encounter: Secondary | ICD-10-CM

## 2015-05-20 DIAGNOSIS — M25571 Pain in right ankle and joints of right foot: Secondary | ICD-10-CM

## 2015-05-20 LAB — POCT CBC
Granulocyte percent: 66.6 %G (ref 37–80)
HEMATOCRIT: 46.7 % (ref 43.5–53.7)
Hemoglobin: 14.9 g/dL (ref 14.1–18.1)
Lymph, poc: 2.3 (ref 0.6–3.4)
MCH, POC: 28 pg (ref 27–31.2)
MCHC: 31.9 g/dL (ref 31.8–35.4)
MCV: 87.8 fL (ref 80–97)
MID (cbc): 0.8 (ref 0–0.9)
MPV: 6.8 fL (ref 0–99.8)
PLATELET COUNT, POC: 333 10*3/uL (ref 142–424)
POC Granulocyte: 6.3 (ref 2–6.9)
POC LYMPH %: 24.8 % (ref 10–50)
POC MID %: 8.6 %M (ref 0–12)
RBC: 5.32 M/uL (ref 4.69–6.13)
RDW, POC: 13.2 %
WBC: 9.4 10*3/uL (ref 4.6–10.2)

## 2015-05-20 LAB — URIC ACID: URIC ACID, SERUM: 11 mg/dL — AB (ref 4.0–7.8)

## 2015-05-20 LAB — POCT SEDIMENTATION RATE: POCT SED RATE: 17 mm/h (ref 0–22)

## 2015-05-20 MED ORDER — DOXYCYCLINE HYCLATE 100 MG PO TABS: 100.0000 mg | ORAL_TABLET | Freq: Two times a day (BID) | ORAL | Status: DC

## 2015-05-20 MED ORDER — TRAMADOL HCL 50 MG PO TABS: 50.0000 mg | ORAL_TABLET | Freq: Three times a day (TID) | ORAL | Status: DC | PRN

## 2015-05-20 MED ORDER — HYDROCODONE-ACETAMINOPHEN 5-325 MG PO TABS: 1.0000 | ORAL_TABLET | Freq: Four times a day (QID) | ORAL | Status: DC | PRN

## 2015-05-20 NOTE — Progress Notes (Addendum)
Patient ID: Alan Kennedy, male   DOB: [birth year], 54 y.o.   MRN: [id number]    This chart was scribed for Nena Jordan, MD by [facility], medical scribe at [facility].The patient was seen in exam room 06 and the patient's care was started at 10:28 AM.  Chief Complaint:  Chief Complaint  Patient presents with  . Foot Injury    Onset 1 and half weeks ago   HPI: Alan Kennedy is a 54 y.o. 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 He was seen at [facility], placed in a boot and given naproxen which has not helped. He has also taken mortin, tylenol and advil.     Past Medical History  Diagnosis Date  . Depression   . Arthritis     knees  . Osteoarthritis of left knee, medial 11/14/2013  . Gout    Past Surgical History  Procedure Laterality Date  . Knee arthroscopy      30% cartiledge removal, 2x's on the R, multiple times on the L   . Hand reconstruction Left   . Shoulder surgery Right     arthroscopy  . Spinal cord stimulator implant  2009  . Back surgery      x3  . Nose surgery  2000    reconstruction   . Knee surgery Left 11/14/2013    DR LANDAU            UNICOMPARTMENTAL   . Partial knee arthroplasty Left 11/14/2013    Procedure: UNICOMPARTMENTAL LEFT KNEE;  Surgeon: Johnny Bridge, MD;  Location: [facility];  Service: Orthopedics;  Laterality: Left;   Social History   Social History  . Marital Status: Married    Spouse Name: N/A  . Number of Children: N/A  . Years of Education: N/A   Social History Main Topics  . Smoking status: Never Smoker   . Smokeless tobacco: Never Used  . Alcohol Use: No  . Drug Use: No  . Sexual Activity: Not Asked   Other Topics Concern  . None   Social History Narrative   No family history on file. No Known Allergies Prior to Admission medications   Medication Sig Start Date End  Date Taking? Authorizing Provider  Multiple Vitamins-Minerals (MULTIVITAMIN WITH MINERALS) tablet Take 1 tablet by mouth daily.   Yes Historical Provider, MD     ROS: The patient denies fevers, chills, night sweats, unintentional weight loss, chest pain, palpitations, wheezing, dyspnea on exertion, nausea, vomiting, abdominal pain, dysuria, hematuria, melena, numbness, weakness, or tingling.   All other systems have been reviewed and were otherwise negative with the exception of those mentioned in the HPI and as above.    PHYSICAL EXAM: Filed Vitals:   05/20/15 1006  BP: 150/70  Pulse: 90  Temp: 98.3 F (36.8 C)  Resp: 16   Body mass index is 26.37 kg/(m^2).  General: Alert, no acute distress HEENT:  Normocephalic, atraumatic, oropharynx patent. Eye: Juliette Mangle [facility] Cardiovascular:  Regular rate and rhythm, no rubs murmurs or gallops.  No Carotid bruits, radial pulse intact. No pedal edema.  Respiratory: Clear to auscultation bilaterally.  No wheezes, rales, or rhonchi.  No cyanosis, no use of accessory musculature Abdominal: No organomegaly, abdomen is soft and non-tender, positive bowel sounds.  No masses. Musculoskeletal: Gait intact. No edema, tenderness. Calf  is normal, redness and swelling over the dorsum over the foot. There is exquisite pain over the metatarsal and tarsal bones of the right foot. Skin: No rashes. Neurologic: Facial musculature symmetric. Psychiatric: Patient acts appropriately throughout our interaction.  LABS: Results for orders placed or performed in visit on 05/20/15  POCT CBC  Result Value Ref Range   WBC 9.4 4.6 - 10.2 K/uL   Lymph, poc 2.3 0.6 - 3.4   POC LYMPH PERCENT 24.8 10 - 50 %L   MID (cbc) 0.8 0 - 0.9   POC MID % 8.6 0 - 12 %M   POC Granulocyte 6.3 2 - 6.9   Granulocyte percent 66.6 37 - 80 %G   RBC 5.32 4.69 - 6.13 M/uL   Hemoglobin 14.9 14.1 - 18.1 g/dL   HCT, POC 46.7 43.5 - 53.7 %   MCV 87.8 80 - 97 fL   MCH, POC 28.0 27 - 31.2 pg     MCHC 31.9 31.8 - 35.4 g/dL   RDW, POC 13.2 %   Platelet Count, POC 333 142 - 424 K/uL   MPV 6.8 0 - 99.8 fL   EKG/XRAY:   Primary read interpreted by Dr. Everlene Farrier at [facility]. No definite fracture seen. Please comment on them midfoot area. There is soft tissue swelling.   ASSESSMENT/PLAN: The radiologist did not see a fracture. He was given very limited number of pain pills. He will follow-up with Dr. Mardelle Matte regarding his foot injury .[name] Kitchen There was mild redness of the foot and patient placed on doxycycline to cover for developing cellulitis. White cell count was normal. I had ordered an MRI of the foot but this could not be done because of an implanted neurostimulator in his back. A CT will be ordered instead. Uric acid  is elevated and he will be on colchicine twice a day. Gross side effects, risk and benefits, and alternatives of medications d/w patient. Patient is aware that all medications have potential sideeffects and we are unable to predict every sideeffect or drug-drug interaction that may occur.I personally performed the services described in this documentation, which was scribed in my presence. The recorded information has been reviewed and is accurate.  Nena Jordan, MD   Arlyss Queen MD 05/20/2015 10:28 AM

## 2015-05-22 ENCOUNTER — Other Ambulatory Visit: Payer: Self-pay | Admitting: *Deleted

## 2015-05-22 ENCOUNTER — Telehealth: Payer: Self-pay

## 2015-05-23 ENCOUNTER — Other Ambulatory Visit: Payer: Self-pay | Admitting: Family Medicine

## 2015-05-23 ENCOUNTER — Encounter: Payer: Self-pay | Admitting: Family Medicine

## 2015-05-23 MED ORDER — COLCHICINE 0.6 MG PO TABS: 0.6000 mg | ORAL_TABLET | Freq: Two times a day (BID) | ORAL | Status: DC

## 2015-05-23 NOTE — Telephone Encounter (Signed)
error 

## 2015-05-24 ENCOUNTER — Ambulatory Visit
Admission: RE | Admit: 2015-05-24 | Discharge: 2015-05-24 | Disposition: A | Discharge status: Home / Self Care | Payer: 59 | Source: Ambulatory Visit | Attending: Emergency Medicine | Admitting: Emergency Medicine

## 2015-05-25 ENCOUNTER — Telehealth: Payer: Self-pay

## 2015-05-25 NOTE — Telephone Encounter (Signed)
Pt is returning ur call    Best number [phone]

## 2015-05-26 ENCOUNTER — Telehealth: Payer: Self-pay | Admitting: Family Medicine

## 2015-05-26 NOTE — Telephone Encounter (Signed)
Spoke with patient about his xray report

## 2015-10-13 ENCOUNTER — Ambulatory Visit (INDEPENDENT_AMBULATORY_CARE_PROVIDER_SITE_OTHER): Payer: Commercial Managed Care - HMO | Admitting: Family Medicine

## 2015-10-13 VITALS — BP 140/92 | HR 90 | Temp 98.5°F | Resp 18 | Ht 75.0 in | Wt 224.0 lb

## 2015-10-13 DIAGNOSIS — R319 Hematuria, unspecified: Secondary | ICD-10-CM | POA: Diagnosis not present

## 2015-10-13 DIAGNOSIS — N50819 Testicular pain, unspecified: Secondary | ICD-10-CM

## 2015-10-13 LAB — POCT URINALYSIS DIP (MANUAL ENTRY)
Glucose, UA: NEGATIVE
LEUKOCYTES UA: NEGATIVE
Nitrite, UA: NEGATIVE
PH UA: 6
PROTEIN UA: NEGATIVE
SPEC GRAV UA: 1.025
UROBILINOGEN UA: 0.2

## 2015-10-13 LAB — POC MICROSCOPIC URINALYSIS (UMFC): Mucus: ABSENT

## 2015-10-13 MED ORDER — DOXYCYCLINE HYCLATE 100 MG PO TABS: 100.0000 mg | ORAL_TABLET | Freq: Two times a day (BID) | ORAL | Status: DC

## 2015-10-13 MED ORDER — HYDROCODONE-ACETAMINOPHEN 5-325 MG PO TABS: 1.0000 | ORAL_TABLET | Freq: Four times a day (QID) | ORAL | Status: DC | PRN

## 2015-10-13 NOTE — Progress Notes (Signed)
This chart was scribed for Alan Haber, MD by Alan Kennedy, medical scribe at [facility].The patient was seen in exam room 09 and the patient's Kennedy was started at 11:07 AM.  Patient ID: Alan Kennedy MRN: [id number], DOB: Apr 02, 1961, 55 y.o. Date of Encounter: 10/13/2015  Primary Physician: Alan Frees, MD  Chief Complaint:  Chief Complaint  Patient presents with  . Testicle Pain    lt-since wednesday   . Hematuria   HPI:  Alan Kennedy is a 55 y.o. male who presents to [facility] due to testiclular pain for the past four days. The pain is bilateral but worse on the left since onset the pain has traveled down to his penis. One episode of emesis due to the pain. He has been unable to sleep due to the pain. Associated hematuria. Seen by urology and given meloxicam, and tramadol, also tried tylenol and aleve. No ultra sound. Hx of kidney stones, but has not had any flank pain. A Network engineer.  Patient has had a vasectomy  Past Medical History  Diagnosis Date  . Depression   . Arthritis     knees  . Osteoarthritis of left knee, medial 11/14/2013  . Gout      Home Meds: Prior to Admission medications   Medication Sig Start Date End Date Taking? Authorizing Provider  meloxicam (MOBIC) 15 MG tablet Take 15 mg by mouth daily.   Yes Historical Provider, MD  Multiple Vitamins-Minerals (MULTIVITAMIN WITH MINERALS) tablet Take 1 tablet by mouth daily.   Yes Historical Provider, MD  colchicine 0.6 MG tablet Take 1 tablet (0.6 mg total) by mouth 2 (two) times daily. Patient not taking: Reported on 10/13/2015 05/23/15   Alan Russian, MD  doxycycline (VIBRA-TABS) 100 MG tablet Take 1 tablet (100 mg total) by mouth 2 (two) times daily. Patient not taking: Reported on 10/13/2015 05/20/15   Alan Russian, MD  HYDROcodone-acetaminophen (NORCO) 5-325 MG per tablet Take 1 tablet by mouth every 6 (six) hours as needed. Patient not taking:  Reported on 10/13/2015 05/20/15   Alan Russian, MD  traMADol (ULTRAM) 50 MG tablet Take 1 tablet (50 mg total) by mouth every 8 (eight) hours as needed. Patient not taking: Reported on 10/13/2015 05/20/15   Alan Russian, MD    Allergies: No Known Allergies  Social History   Social History  . Marital Status: Married    Spouse Name: N/A  . Number of Children: N/A  . Years of Education: N/A   Occupational History  . Not on file.   Social History Main Topics  . Smoking status: Never Smoker   . Smokeless tobacco: Never Used  . Alcohol Use: No  . Drug Use: No  . Sexual Activity: Not on file   Other Topics Concern  . Not on file   Social History Narrative    Review of Systems: Constitutional: negative for chills, fever, night sweats, weight changes, or fatigue  HEENT: negative for vision changes, hearing loss, congestion, rhinorrhea, ST, epistaxis, or sinus pressure Cardiovascular: negative for chest pain or palpitations Respiratory: negative for hemoptysis, wheezing, shortness of breath, or cough Abdominal: negative for abdominal pain, nausea, diarrhea, or constipation. Positive for vomiting.  Genitourinary: Positive for testicular pain, penile pain and hematuria. Dermatological: negative for rash Neurologic: negative for headache, dizziness, or syncope All other systems reviewed and are otherwise negative with the exception to those above and in the HPI.  Physical  Exam: Blood pressure 140/92, pulse 90, temperature 98.5 F (36.9 C), temperature source Oral, resp. rate 18, height 6\' 3"  (1.905 m), weight 224 lb (101.606 kg), SpO2 98 %., Body mass index is 28 kg/(m^2). General: Well developed, well nourished, in no acute distress. Head: Normocephalic, atraumatic, eyes without discharge, sclera non-icteric, nares are without discharge. Bilateral auditory canals clear, TM's are without perforation, pearly grey and translucent with reflective cone of light bilaterally. Oral cavity  moist, posterior pharynx without exudate, erythema, peritonsillar abscess, or post nasal drip.  Neck: Supple. No thyromegaly. Full ROM. No lymphadenopathy. Lungs: Clear bilaterally to auscultation without wheezes, rales, or rhonchi. Breathing is unlabored. Heart: RRR with S1 S2. No murmurs, rubs, or gallops appreciated. Abdomen: Soft, non-tender, non-distended with normoactive bowel sounds. No hepatomegaly. No rebound/guarding. No obvious abdominal masses. Msk:  Strength and tone normal for age. Extremities/Skin: Warm and dry. No clubbing or cyanosis. No edema. No rashes or suspicious lesions. Genitourinary: Very tender, and mildly swollen left testicles. No groin tenderness or swelling. Neuro: Alert and oriented X 3. Moves all extremities spontaneously. Gait is normal. CNII-XII grossly in tact. Psych:  Responds to questions appropriately with a normal affect.    ASSESSMENT AND PLAN:  55 y.o. year old male with   By signing my name below, I, Alan Kennedy, attest that this documentation has been prepared under the direction and in the presence of Alan Haber, MD.  Electronically Signed: Lora Kennedy, medical scribe. 10/13/2015 11:24 AM.  This chart was scribed in my presence and reviewed by me personally.    ICD-9-CM ICD-10-CM   1. Blood in urine 599.70 R31.9 POCT Microscopic Urinalysis ([facility])     POCT urinalysis dipstick     doxycycline (VIBRA-TABS) 100 MG tablet     HYDROcodone-acetaminophen (NORCO) 5-325 MG tablet     Urine culture  2. Epididymal pain 608.9 N50.819      Signed, Alan Haber, MD

## 2015-10-14 ENCOUNTER — Telehealth: Payer: Self-pay

## 2015-10-14 DIAGNOSIS — R110 Nausea: Secondary | ICD-10-CM

## 2015-10-14 LAB — URINE CULTURE: Colony Count: 2000

## 2015-10-14 MED ORDER — PROMETHAZINE HCL 12.5 MG PO TABS
12.5000 mg | ORAL_TABLET | Freq: Three times a day (TID) | ORAL | Status: DC | PRN
Start: 2015-10-14 — End: 2017-03-18

## 2015-10-14 NOTE — Telephone Encounter (Signed)
Pt is experiencing a lot of nausea and is needing something called in for it   Best number [phone]

## 2015-10-14 NOTE — Telephone Encounter (Signed)
Patient is calling because he's gotten worse

## 2015-10-15 NOTE — Telephone Encounter (Signed)
Pt wanted to give Dr. Joseph Art an update: He is still feeling the same and he did receive the medication and was thankful.

## 2016-10-06 DIAGNOSIS — Z0000 Encounter for general adult medical examination without abnormal findings: Secondary | ICD-10-CM | POA: Diagnosis not present

## 2016-10-06 DIAGNOSIS — E784 Other hyperlipidemia: Secondary | ICD-10-CM | POA: Diagnosis not present

## 2016-10-26 DIAGNOSIS — E291 Testicular hypofunction: Secondary | ICD-10-CM | POA: Diagnosis not present

## 2017-01-01 DIAGNOSIS — L0390 Cellulitis, unspecified: Secondary | ICD-10-CM | POA: Diagnosis not present

## 2017-01-01 DIAGNOSIS — S30861A Insect bite (nonvenomous) of abdominal wall, initial encounter: Secondary | ICD-10-CM | POA: Diagnosis not present

## 2017-01-01 DIAGNOSIS — W57XXXA Bitten or stung by nonvenomous insect and other nonvenomous arthropods, initial encounter: Secondary | ICD-10-CM | POA: Diagnosis not present

## 2017-01-07 DIAGNOSIS — E291 Testicular hypofunction: Secondary | ICD-10-CM | POA: Diagnosis not present

## 2017-01-07 DIAGNOSIS — E784 Other hyperlipidemia: Secondary | ICD-10-CM | POA: Diagnosis not present

## 2017-02-24 DIAGNOSIS — E291 Testicular hypofunction: Secondary | ICD-10-CM | POA: Diagnosis not present

## 2017-03-18 ENCOUNTER — Encounter: Payer: Self-pay | Admitting: Physician Assistant

## 2017-03-18 ENCOUNTER — Ambulatory Visit (INDEPENDENT_AMBULATORY_CARE_PROVIDER_SITE_OTHER): Payer: 59

## 2017-03-18 ENCOUNTER — Ambulatory Visit (INDEPENDENT_AMBULATORY_CARE_PROVIDER_SITE_OTHER): Payer: 59 | Admitting: Physician Assistant

## 2017-03-18 VITALS — BP 150/90 | HR 82 | Temp 98.2°F | Resp 16 | Ht 74.0 in | Wt 215.0 lb

## 2017-03-18 DIAGNOSIS — R110 Nausea: Secondary | ICD-10-CM

## 2017-03-18 DIAGNOSIS — S92415A Nondisplaced fracture of proximal phalanx of left great toe, initial encounter for closed fracture: Secondary | ICD-10-CM | POA: Diagnosis not present

## 2017-03-18 DIAGNOSIS — S92425A Nondisplaced fracture of distal phalanx of left great toe, initial encounter for closed fracture: Secondary | ICD-10-CM | POA: Diagnosis not present

## 2017-03-18 DIAGNOSIS — M79675 Pain in left toe(s): Secondary | ICD-10-CM | POA: Diagnosis not present

## 2017-03-18 MED ORDER — ONDANSETRON HCL 8 MG PO TABS: 8.0000 mg | ORAL_TABLET | Freq: Three times a day (TID) | ORAL | 0 refills | Status: DC | PRN

## 2017-03-18 MED ORDER — HYDROCODONE-ACETAMINOPHEN 5-325 MG PO TABS: 1.0000 | ORAL_TABLET | Freq: Four times a day (QID) | ORAL | 0 refills | Status: DC | PRN

## 2017-03-18 NOTE — Patient Instructions (Addendum)
You will receive a phone call to schedule an appt with ortho. Please make this follow-up appointment In the meantime, please keep your foot elevated several times a day and apply ice to the area. It is important to keep your swelling at a minimum. Wear your post-op shoe until otherwise specified by ortho.  Thank you for coming in today. I hope you feel we met your needs.  Feel free to call PCP if you have any questions or further requests.  Please consider signing up for MyChart if you do not already have it, as this is a great way to communicate with me.  Best,  Whitney McVey, PA-C    Toe Fracture A toe fracture is a break in one of the toe bones (phalanges). What are the causes? This condition may be caused by:  Dropping a heavy object on your toe.  Stubbing your toe.  Overusing your toe or doing repetitive exercise.  Twisting or stretching your toe out of place.  What increases the risk? This condition is more likely to develop in people who:  Play contact sports.  Have a bone disease.  Have a low calcium level.  What are the signs or symptoms? The main symptoms of this condition are swelling and pain in the toe. The pain may get worse with standing or walking. Other symptoms include:  Bruising.  Stiffness.  Numbness.  A change in the way the toe looks.  Broken bones that poke through the skin.  Blood beneath the toenail.  How is this diagnosed? This condition is diagnosed with a physical exam. You may also have X-rays. How is this treated? Treatment for this condition depends on the type of fracture and its severity. Treatment may involve:  Taping the broken toe to a toe that is next to it (buddy taping). This is the most common treatment for fractures in which the bone has not moved out of place (nondisplaced fracture).  Wearing a shoe that has a wide, rigid sole to protect the toe and to limit its movement.  Wearing a walking cast.  Having a procedure  to move the toe back into place.  Surgery. This may be needed: ? If there are many pieces of broken bone that are out of place (displaced). ? If the toe joint breaks. ? If the bone breaks through the skin.  Physical therapy. This is done to help regain movement and strength in the toe.  You may need follow-up X-rays to make sure that the bone is healing well and staying in position. Follow these instructions at home: If you have a cast:  Do not stick anything inside the cast to scratch your skin. Doing that increases your risk of infection.  Check the skin around the cast every day. Report any concerns to your health care provider. You may put lotion on dry skin around the edges of the cast. Do not apply lotion to the skin underneath the cast.  Do not put pressure on any part of the cast until it is fully hardened. This may take several hours.  Keep the cast clean and dry. Bathing  Do not take baths, swim, or use a hot tub until your health care provider approves. Ask your health care provider if you can take showers. You may only be allowed to take sponge baths for bathing.  If your health care provider approves bathing and showering, cover the cast or bandage (dressing) with a watertight plastic bag to protect it from water.  Do not let the cast or dressing get wet. Managing pain, stiffness, and swelling  If you do not have a cast, apply ice to the injured area, if directed. ? Put ice in a plastic bag. ? Place a towel between your skin and the bag. ? Leave the ice on for 20 minutes, 2-3 times per day.  Move your toes often to avoid stiffness and to lessen swelling.  Raise (elevate) the injured area above the level of your heart while you are sitting or lying down. Driving  Do not drive or operate heavy machinery while taking pain medicine.  Do not drive while wearing a cast on a foot that you use for driving. Activity  Return to your normal activities as directed by your  health care provider. Ask your health care provider what activities are safe for you.  Perform exercises daily as directed by your health care provider or physical therapist. Safety  Do not use the injured limb to support your body weight until your health care provider says that you can. Use crutches or other assistive devices as directed by your health care provider. General instructions  If your toe was treated with buddy taping, follow your health care provider's instructions for changing the gauze and tape. Change it more often: ? The gauze and tape get wet. If this happens, dry the space between the toes. ? The gauze and tape are too tight and cause your toe to become pale or numb.  Wear a protective shoe as directed by your health care provider. If you were not given a protective shoe, wear sturdy, supportive shoes. Your shoes should not pinch your toes and should not fit tightly against your toes.  Do not use any tobacco products, including cigarettes, chewing tobacco, or e-cigarettes. Tobacco can delay bone healing. If you need help quitting, ask your health care provider.  Take medicines only as directed by your health care provider.  Keep all follow-up visits as directed by your health care provider. This is important. Contact a health care provider if:  You have a fever.  Your pain medicine is not helping.  Your toe is cold.  Your toe is numb.  You still have pain after one week of rest and treatment.  You still have pain after your health care provider has said that you can start walking again.  You have pain, tingling, or numbness in your foot that is not going away. Get help right away if:  You have severe pain.  You have redness or inflammation in your toe that is getting worse.  You have pain or numbness in your toe that is getting worse.  Your toe turns blue. This information is not intended to replace advice given to you by your health care provider. Make  sure you discuss any questions you have with your health care provider. Document Released: 09/11/2000 Document Revised: 05/18/2016 Document Reviewed: 07/11/2014 Elsevier Interactive Patient Education  2018 [employer].  IF you received an x-ray today, you will receive an invoice from [facility]. Please contact [facility] at [phone] with questions or concerns regarding your invoice.   IF you received labwork today, you will receive an invoice from [facility]. Please contact [facility] at [phone] with questions or concerns regarding your invoice.   Our billing staff will not be able to assist you with questions regarding bills from these companies.  You will be contacted with the lab results as soon as they are available. The fastest way to  get your results is to activate your My Chart account. Instructions are located on the last page of this paperwork. If you have not heard from [place] regarding the results in 2 weeks, please contact this office.
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
## 2017-03-25 DIAGNOSIS — M79675 Pain in left toe(s): Secondary | ICD-10-CM | POA: Diagnosis not present

## 2017-03-25 DIAGNOSIS — S92425A Nondisplaced fracture of distal phalanx of left great toe, initial encounter for closed fracture: Secondary | ICD-10-CM | POA: Diagnosis not present

## 2017-03-25 DIAGNOSIS — S92425D Nondisplaced fracture of distal phalanx of left great toe, subsequent encounter for fracture with routine healing: Secondary | ICD-10-CM | POA: Diagnosis not present

## 2017-04-02 DIAGNOSIS — S92425D Nondisplaced fracture of distal phalanx of left great toe, subsequent encounter for fracture with routine healing: Secondary | ICD-10-CM | POA: Diagnosis not present

## 2017-04-02 DIAGNOSIS — M79675 Pain in left toe(s): Secondary | ICD-10-CM | POA: Diagnosis not present

## 2017-04-16 DIAGNOSIS — E781 Pure hyperglyceridemia: Secondary | ICD-10-CM | POA: Diagnosis not present

## 2017-04-16 DIAGNOSIS — E784 Other hyperlipidemia: Secondary | ICD-10-CM | POA: Diagnosis not present

## 2017-04-16 DIAGNOSIS — E291 Testicular hypofunction: Secondary | ICD-10-CM | POA: Diagnosis not present

## 2017-04-26 DIAGNOSIS — S92425D Nondisplaced fracture of distal phalanx of left great toe, subsequent encounter for fracture with routine healing: Secondary | ICD-10-CM | POA: Diagnosis not present

## 2017-05-31 NOTE — Progress Notes (Addendum)
Patient ID: Alan Kennedy, male   DOB: [birth year], 56 y.o.   MRN: [id number]          Referring physician: Shirline Frees  Reason for consultation: Low testosterone   Chief complaint:   History of Present Illness  Hypogonadismwas diagnosed in 2013  No history is available from the time of his original diagnosis The patient thinks that maybe he was being evaluated for problems with erectile dysfunction and possibly low libido but at that time he did not complain of any unusual fatigue  Since then he has been on testosterone gel, mostly using Testim He does not think that using this has helped his erectile dysfunction or libido or change his energy level  He has had no recent complaints offatigue but does seem to have mild decreased motivation and decreased libido  His applying the gel as directed consistently everyday, and has been using 2 tubes daily He is not satisfied with trying to do this since it takes a while for him to apply it and gel is sticky    There is no history of the following: Hot flushes, sweats, breast enlargement, long term anabolic steroid use, history of testicular injury or mumps in childhood. No history of osteopenia or low impact fracture  Prior lab results showtestosterone levels ranging from 1 91-224 this year with direct free testosterone levels in the normal range around 14   No results found for: TESTOSTERONE  Prolactin level: Not available Last LH level was reported 9  No results found for: LH        Allergies as of 06/01/2017   No Known Allergies     Medication List       Accurate as of 06/01/17 11:50 AM. Always use your most recent med list.          atorvastatin 20 MG tablet Commonly known as:  LIPITOR Take 20 mg by mouth daily.   colchicine 0.6 MG tablet Take 1 tablet (0.6 mg total) by mouth 2 (two) times daily.   HYDROcodone-acetaminophen 5-325 MG tablet Commonly known as:  NORCO Take 1 tablet by mouth  every 6 (six) hours as needed.   multivitamin with minerals tablet Take 1 tablet by mouth daily.   TESTIM 50 MG/5GM (1%) Gel Generic drug:  testosterone   VASCEPA 1 g Caps Generic drug:  Icosapent Ethyl       Allergies: No Known Allergies  Past Medical History:  Diagnosis Date  . Arthritis    knees  . Depression   . Gout   . Osteoarthritis of left knee, medial 11/14/2013    Past Surgical History:  Procedure Laterality Date  . BACK SURGERY     x3  . HAND RECONSTRUCTION Left   . KNEE ARTHROSCOPY     30% cartiledge removal, 2x's on the R, multiple times on the L   . KNEE SURGERY Left 11/14/2013   DR LANDAU            UNICOMPARTMENTAL   . NOSE SURGERY  2000   reconstruction   . PARTIAL KNEE ARTHROPLASTY Left 11/14/2013   Procedure: UNICOMPARTMENTAL LEFT KNEE;  Surgeon: Johnny Bridge, MD;  Location: [facility];  Service: Orthopedics;  Laterality: Left;  . SHOULDER SURGERY Right    arthroscopy  . SPINAL CORD STIMULATOR IMPLANT  2009    No family history on file.  Social History:  reports that he has never smoked. He has never used smokeless tobacco. He reports that he does not drink  alcohol or use drugs.  Review of Systems  Constitutional: Negative for weight gain.  HENT: Negative for headaches.   Respiratory: Negative for shortness of breath.   Cardiovascular: Negative for chest pain and leg swelling.  Gastrointestinal: Negative for constipation and diarrhea.  Endocrine: Positive for decreased libido and erectile dysfunction. Negative for fatigue.       Erectile dysfunction long-standing, does not always get complete relief with Viagra prescribed by PCP  Musculoskeletal: Positive for joint pain and back pain.       He has occasional pains in his joints with humid weather.  Previously has had significant knee joint pains requiring surgery.  Also previously had back pain  Skin: Negative for rash.  Neurological: Negative for weakness, numbness and tingling.    Psychiatric/Behavioral: Positive for insomnia.    His last fasting glucose was 110, no A1c available. Has been able to maintain his weight Does not do any formal exercise recently  He has had long-standing hyperlipidemia including triglycerides of 411 recently He was told to take 4 capsules of Lovaza instead of 2 daily but is taking only 3 recently   General Examination:   BP 120/86   Pulse 60   Ht 6' 4.5" (1.943 m)   Wt 215 lb 9.6 oz (97.8 kg)   SpO2 98%   BMI 25.90 kg/m   GENERAL APPEARANCE well built and nourished SKIN:normal, no rash or pigmentation.  HEENT:Oral mucosa normal.  EYES:normal external appearance of eyes, Fundii benign.    NECK:no lymphadenopathy, no thyromegaly.  CHEST: Gynecomastia absent LUNGS:clear to auscultation bilaterally, no wheezes, rhonchi, rales.    HEART:normal S1 And S2, no S3, S4, murmur or click.  ABDOMEN:no hepatosplenomegaly, no masses palpated, soft and not tender.    MALE GENITOURINARY:left testicle 3.5 cm and right testicle about 3 cm and slightly softer.   MUSCULOSKELETALNo enlargement or deformity of joints.  EXTREMITIES:no clubbing, no edema.  NEUROLOGIC EXAM: Biceps reflexes normal (2+) bilaterally.   Assessment/ Plan:  Hypogonadism.  Although he has a relatively low total testosterone his free testosterone level has been normal more recently. No baseline levels available from was no time of diagnosis Also not clear if he has had a prolactin level done previously Not clear what his baseline symptoms were from low testosterone and may have been only decreased libido He has not had any subjective improvement in his libido with testosterone gel and his main issues are with erectile dysfunction  Currently the patient is not satisfied with the Testim because of the difficulty with applying the large volume and sticky gel that needs to be applied  Discussed various  options for testosterone supplementation including transdermal gel or liquid preparations,  testosterone injections and clomiphene.  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

## 2017-06-01 ENCOUNTER — Ambulatory Visit (INDEPENDENT_AMBULATORY_CARE_PROVIDER_SITE_OTHER): Payer: 59 | Admitting: Endocrinology

## 2017-06-01 ENCOUNTER — Encounter: Payer: Self-pay | Admitting: Endocrinology

## 2017-06-01 VITALS — BP 120/86 | HR 60 | Ht 76.5 in | Wt 215.6 lb

## 2017-06-01 DIAGNOSIS — R5383 Other fatigue: Secondary | ICD-10-CM | POA: Diagnosis not present

## 2017-06-01 DIAGNOSIS — R7301 Impaired fasting glucose: Secondary | ICD-10-CM

## 2017-06-01 DIAGNOSIS — E230 Hypopituitarism: Secondary | ICD-10-CM | POA: Diagnosis not present

## 2017-06-01 MED ORDER — CLOMIPHENE CITRATE 50 MG PO TABS: ORAL_TABLET | ORAL | 2 refills | Status: DC

## 2017-06-01 NOTE — Patient Instructions (Signed)
After 3 weeks of Clomiphene reduce gel to 1 tube

## 2017-06-04 ENCOUNTER — Telehealth: Payer: Self-pay

## 2017-06-04 NOTE — Telephone Encounter (Signed)
Called the office of Dr. Elveria Rising. Damita Dunnings, MD at [facility]and spoke to medical records and she stated that she is going to fax over the lab records from 2013 to our fax number [phone]. This request was on a note from Dr. Dwyane Dee.

## 2017-06-09 ENCOUNTER — Telehealth: Payer: Self-pay | Admitting: *Deleted

## 2017-06-09 NOTE — Telephone Encounter (Signed)
Spoke to pt, told him received fax from insurance that Prior Auth was denied for Clomiphene. Pt verbalized understanding and said that is fine he already picked up the Rx and paid out of pocket. Told him okay.

## 2017-06-14 DIAGNOSIS — D126 Benign neoplasm of colon, unspecified: Secondary | ICD-10-CM | POA: Diagnosis not present

## 2017-06-14 DIAGNOSIS — Z86010 Personal history of colonic polyps: Secondary | ICD-10-CM | POA: Diagnosis not present

## 2017-07-08 ENCOUNTER — Other Ambulatory Visit (INDEPENDENT_AMBULATORY_CARE_PROVIDER_SITE_OTHER): Payer: 59

## 2017-07-08 DIAGNOSIS — E230 Hypopituitarism: Secondary | ICD-10-CM | POA: Diagnosis not present

## 2017-07-08 DIAGNOSIS — R7301 Impaired fasting glucose: Secondary | ICD-10-CM | POA: Diagnosis not present

## 2017-07-08 DIAGNOSIS — R5383 Other fatigue: Secondary | ICD-10-CM

## 2017-07-08 LAB — TSH: TSH: 2.8 u[IU]/mL (ref 0.35–4.50)

## 2017-07-08 LAB — GLUCOSE, RANDOM: Glucose, Bld: 118 mg/dL — ABNORMAL HIGH (ref 70–99)

## 2017-07-08 LAB — T4, FREE: Free T4: 0.77 ng/dL (ref 0.60–1.60)

## 2017-07-08 LAB — HEMOGLOBIN A1C: HEMOGLOBIN A1C: 5.5 % (ref 4.6–6.5)

## 2017-07-08 LAB — LUTEINIZING HORMONE: LH: 6.03 m[IU]/mL (ref 1.50–9.30)

## 2017-07-09 LAB — TESTOSTERONE, FREE, TOTAL, SHBG
Sex Hormone Binding: 26.9 nmol/L (ref 19.3–76.4)
Testosterone, Free: 22.2 pg/mL (ref 7.2–24.0)
Testosterone: 471 ng/dL (ref 264–916)

## 2017-07-09 LAB — PROLACTIN: Prolactin: 9.3 ng/mL (ref 4.0–15.2)

## 2017-07-12 NOTE — Progress Notes (Signed)
Patient ID: Alan Kennedy, male   DOB: October 02, 1960, 56 y.o.   MRN: [id number]          Referring physician: Shirline Frees   Chief complaint: Low testosterone   History of Present Illness  Hypogonadismwas diagnosed in 2013  Prior history: No history is available from the time of his original diagnosis The patient thinks that maybe he was being evaluated for problems with erectile dysfunction and possibly low libido but at that time he did not complain of any unusual fatigue Since then he had been on testosterone gel, mostly using Testim He did not like using this because it takes a while for him to apply it and gel is sticky He does not think that using this has helped his erectile dysfunction or libido or change his energy level  Prior lab results showtestosterone levels ranging from 1 91-224 this year with direct free testosterone levels in the normal range around 14   RECENT history: He has had no  complaints offatigue on his initial consultation but does seem to have mild decreased motivation and decreased libido  With a trial of clomiphene since early September he thinks that at the end of the month he was starting to feel better motivation and libido However he ran out of the prescription at the end of the month and did not know he had a refill His testosterone level including free testosterone level is back to normal  Lab Results  Component Value Date   TESTOSTERONE 471 07/08/2017    Prolactin level: Normal Previous LH level was reported as 9  Lab Results  Component Value Date   LH 6.03 07/08/2017          Allergies as of 07/13/2017   No Known Allergies     Medication List       Accurate as of 07/13/17  8:30 AM. Always use your most recent med list.          atorvastatin 20 MG tablet Commonly known as:  LIPITOR Take 20 mg by mouth daily.   clomiPHENE 50 MG tablet Commonly known as:  CLOMID Half tablet 3 times a week   colchicine 0.6  MG tablet Take 1 tablet (0.6 mg total) by mouth 2 (two) times daily.   HYDROcodone-acetaminophen 5-325 MG tablet Commonly known as:  NORCO Take 1 tablet by mouth every 6 (six) hours as needed.   multivitamin with minerals tablet Take 1 tablet by mouth daily.   TESTIM 50 MG/5GM (1%) Gel Generic drug:  testosterone   VASCEPA 1 g Caps Generic drug:  Icosapent Ethyl       Allergies: No Known Allergies  Past Medical History:  Diagnosis Date  . Arthritis    knees  . Depression   . Gout   . Osteoarthritis of left knee, medial 11/14/2013    Past Surgical History:  Procedure Laterality Date  . BACK SURGERY     x3  . HAND RECONSTRUCTION Left   . KNEE ARTHROSCOPY     30% cartiledge removal, 2x's on the R, multiple times on the L   . KNEE SURGERY Left 11/14/2013   DR LANDAU            UNICOMPARTMENTAL   . NOSE SURGERY  2000   reconstruction   . PARTIAL KNEE ARTHROPLASTY Left 11/14/2013   Procedure: UNICOMPARTMENTAL LEFT KNEE;  Surgeon: Johnny Bridge, MD;  Location: [facility];  Service: Orthopedics;  Laterality: Left;  . SHOULDER SURGERY Right  arthroscopy  . SPINAL CORD STIMULATOR IMPLANT  2009    History reviewed. No pertinent family history.  Social History:  reports that he has never smoked. He has never used smokeless tobacco. He reports that he does not drink alcohol or use drugs.  Review of Systems    His last fasting glucose was 118 and A1c is 5.5 He consumes significant amount of regular soft drinks Family history is not available  He has had long-standing hyperlipidemia including triglycerides of 411 with PCP He was told to take 4 capsules of Lovaza He does try to exercise now   General Examination:   BP 140/88   Pulse 76   Ht 6\' 4"  (1.93 m)   Wt 213 lb (96.6 kg)   SpO2 96%   BMI 25.93 kg/m    Assessment/ Plan:  ?  Hypogonadism.   He has been on a trial of clomiphene since he did not apparently have adequate levels of total testosterone with  Testim With this his testosterone level including free testosterone not quite normal He does think it he feels subjectively a little better at the end of the month of taking the medication and wants to continue Prolactin normal and no other issues with secondary hypothyroidism   ?  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

## 2017-07-13 ENCOUNTER — Ambulatory Visit (INDEPENDENT_AMBULATORY_CARE_PROVIDER_SITE_OTHER): Payer: 59 | Admitting: Endocrinology

## 2017-07-13 ENCOUNTER — Encounter: Payer: Self-pay | Admitting: Endocrinology

## 2017-07-13 VITALS — BP 140/88 | HR 76 | Ht 76.0 in | Wt 213.0 lb

## 2017-07-13 DIAGNOSIS — R7301 Impaired fasting glucose: Secondary | ICD-10-CM

## 2017-07-13 DIAGNOSIS — E781 Pure hyperglyceridemia: Secondary | ICD-10-CM | POA: Diagnosis not present

## 2017-07-13 DIAGNOSIS — E230 Hypopituitarism: Secondary | ICD-10-CM

## 2017-07-13 NOTE — Patient Instructions (Signed)
Low sugar and Carb intake

## 2017-09-10 ENCOUNTER — Other Ambulatory Visit: Payer: Self-pay | Admitting: Endocrinology

## 2017-12-08 DIAGNOSIS — M109 Gout, unspecified: Secondary | ICD-10-CM | POA: Diagnosis not present

## 2017-12-08 DIAGNOSIS — G4700 Insomnia, unspecified: Secondary | ICD-10-CM | POA: Diagnosis not present

## 2017-12-08 DIAGNOSIS — E782 Mixed hyperlipidemia: Secondary | ICD-10-CM | POA: Diagnosis not present

## 2017-12-17 ENCOUNTER — Other Ambulatory Visit: Payer: Self-pay | Admitting: Endocrinology

## 2018-01-06 DIAGNOSIS — M109 Gout, unspecified: Secondary | ICD-10-CM | POA: Diagnosis not present

## 2018-01-06 DIAGNOSIS — D72829 Elevated white blood cell count, unspecified: Secondary | ICD-10-CM | POA: Diagnosis not present

## 2018-01-06 DIAGNOSIS — R748 Abnormal levels of other serum enzymes: Secondary | ICD-10-CM | POA: Diagnosis not present

## 2018-01-15 ENCOUNTER — Other Ambulatory Visit: Payer: Self-pay

## 2018-01-15 ENCOUNTER — Emergency Department (HOSPITAL_BASED_OUTPATIENT_CLINIC_OR_DEPARTMENT_OTHER)
Admission: EM | Admit: 2018-01-15 | Discharge: 2018-01-15 | Disposition: A | Discharge status: Home / Self Care | Payer: 59 | Attending: Emergency Medicine | Admitting: Emergency Medicine

## 2018-01-15 ENCOUNTER — Emergency Department (HOSPITAL_BASED_OUTPATIENT_CLINIC_OR_DEPARTMENT_OTHER): Payer: 59

## 2018-01-15 ENCOUNTER — Encounter (HOSPITAL_BASED_OUTPATIENT_CLINIC_OR_DEPARTMENT_OTHER): Payer: Self-pay | Admitting: Emergency Medicine

## 2018-01-15 DIAGNOSIS — X509XXA Other and unspecified overexertion or strenuous movements or postures, initial encounter: Secondary | ICD-10-CM | POA: Diagnosis not present

## 2018-01-15 DIAGNOSIS — Y9301 Activity, walking, marching and hiking: Secondary | ICD-10-CM | POA: Insufficient documentation

## 2018-01-15 DIAGNOSIS — Z79899 Other long term (current) drug therapy: Secondary | ICD-10-CM | POA: Insufficient documentation

## 2018-01-15 DIAGNOSIS — S99922A Unspecified injury of left foot, initial encounter: Secondary | ICD-10-CM | POA: Diagnosis present

## 2018-01-15 DIAGNOSIS — Y998 Other external cause status: Secondary | ICD-10-CM | POA: Diagnosis not present

## 2018-01-15 DIAGNOSIS — Y929 Unspecified place or not applicable: Secondary | ICD-10-CM | POA: Diagnosis not present

## 2018-01-15 MED ORDER — IBUPROFEN 600 MG PO TABS: 600.0000 mg | ORAL_TABLET | Freq: Four times a day (QID) | ORAL | 0 refills | Status: DC | PRN

## 2018-01-15 MED ORDER — ACETAMINOPHEN 500 MG PO TABS: 1000.0000 mg | ORAL_TABLET | Freq: Three times a day (TID) | ORAL | 0 refills | Status: AC

## 2018-01-15 MED ORDER — HYDROCODONE-ACETAMINOPHEN 5-325 MG PO TABS: 1.0000 | ORAL_TABLET | Freq: Three times a day (TID) | ORAL | 0 refills | Status: AC | PRN

## 2018-01-15 NOTE — Discharge Instructions (Signed)
Use CAM walker while weight bearing. Bear weight as tolerated. Follow up with Ortho for further evaluation

## 2018-01-15 NOTE — ED Triage Notes (Signed)
L foot injury after stepping wrong down a step at home. Pt reports pain to the outer part of his L foot.

## 2018-01-15 NOTE — ED Provider Notes (Signed)
[facility] EMERGENCY DEPARTMENT Provider Note  CSN: [id number] Arrival date & time: 01/15/18 1158  Chief Complaint(s) Foot Injury  HPI Alan Kennedy is a 57 y.o. male    Foot Injury   The incident occurred 2 days ago. The incident occurred at home. Injury mechanism: twisted ankle on floor transition ledge. The pain is present in the left foot. The quality of the pain is described as aching and throbbing. The pain is moderate. The pain has been constant since onset. Pertinent negatives include no numbness, no inability to bear weight ( though painful with wt bearing), no loss of motion, no loss of sensation and no tingling. The symptoms are aggravated by bearing weight. Alan Kennedy has tried rest for the symptoms.    Past Medical History Past Medical History:  Diagnosis Date  . Arthritis    knees  . Depression   . Gout   . Osteoarthritis of left knee, medial 11/14/2013   Patient Active Problem List   Diagnosis Date Noted  . Osteoarthritis of left knee, medial 11/14/2013  . Knee osteoarthritis 11/14/2013  . MEDIAL MENISCUS TEAR, LEFT 06/23/2010  . KNEE INJURY, LEFT 06/23/2010   Home Medication(s) Prior to Admission medications   Medication Sig Start Date End Date Taking? Authorizing Provider  acetaminophen (TYLENOL) 500 MG tablet Take 2 tablets (1,000 mg total) by mouth every 8 (eight) hours for 5 days. Do not take more than 4000 mg of acetaminophen (Tylenol) in a 24-hour period. Please note that other medicines that you may be prescribed may have Tylenol as well. 01/15/18 01/20/18  Fatima Blank, MD  atorvastatin (LIPITOR) 20 MG tablet Take 20 mg by mouth daily.  04/27/17   [provider]  clomiPHENE (CLOMID) 50 MG tablet TAKE 1/2 TABLET 3 TIMES WEEKLY 12/17/17   Elayne Snare, MD  colchicine 0.6 MG tablet Take 1 tablet (0.6 mg total) by mouth 2 (two) times daily. Patient taking differently: Take 0.6 mg by mouth as needed.  05/23/15   Darlyne Russian, MD    HYDROcodone-acetaminophen (NORCO/VICODIN) 5-325 MG tablet Take 1 tablet by mouth every 8 (eight) hours as needed for up to 5 days for severe pain (That is not improved by your scheduled acetaminophen regimen). Please do not exceed 4000 mg of acetaminophen (Tylenol) a 24-hour period. Please note that Alan Kennedy may be prescribed additional medicine that contains acetaminophen. 01/15/18 01/20/18  Fatima Blank, MD  ibuprofen (ADVIL,MOTRIN) 600 MG tablet Take 1 tablet (600 mg total) by mouth every 6 (six) hours as needed. 01/15/18   CardamaGrayce Sessions, MD  Multiple Vitamins-Minerals (MULTIVITAMIN WITH MINERALS) tablet Take 1 tablet by mouth daily.    [provider]  TESTIM 50 MG/5GM (1%) GEL  04/19/17   [provider]  VASCEPA 1 g CAPS  05/18/17   [provider]  Past Surgical History Past Surgical History:  Procedure Laterality Date  . BACK SURGERY     x3  . HAND RECONSTRUCTION Left   . KNEE ARTHROSCOPY     30% cartiledge removal, 2x's on the R, multiple times on the L   . KNEE SURGERY Left 11/14/2013   DR LANDAU            UNICOMPARTMENTAL   . NOSE SURGERY  2000   reconstruction   . PARTIAL KNEE ARTHROPLASTY Left 11/14/2013   Procedure: UNICOMPARTMENTAL LEFT KNEE;  Surgeon: Johnny Bridge, MD;  Location: [facility];  Service: Orthopedics;  Laterality: Left;  . SHOULDER SURGERY Right    arthroscopy  . SPINAL CORD STIMULATOR IMPLANT  2009   Family History Family History  Family history unknown: Yes    Social History Social History   Tobacco Use  . Smoking status: Never Smoker  . Smokeless tobacco: Never Used  Substance Use Topics  . Alcohol use: No  . Drug use: No   Allergies Patient has no known allergies.  Review of Systems Review of Systems  Neurological: Negative for tingling and numbness.   All other systems are  reviewed and are negative for acute change except as noted in the HPI  Physical Exam Vital Signs  I have reviewed the triage vital signs BP (!) 122/59 (BP Location: Left Arm)   Pulse 68   Temp 98.4 F (36.9 C) (Oral)   Resp 16   Ht 6\' 3"  (1.905 m)   Wt 97.5 kg (215 lb)   SpO2 100%   BMI 26.87 kg/m   Physical Exam  Constitutional: Alan Kennedy is oriented to person, place, and time. Alan Kennedy appears well-developed and well-nourished. No distress.  HENT:  Head: Normocephalic and atraumatic.  Right Ear: External ear normal.  Left Ear: External ear normal.  Nose: Nose normal.  Mouth/Throat: Mucous membranes are normal. No trismus in the jaw.  Eyes: Conjunctivae and EOM are normal. No scleral icterus.  Neck: Normal range of motion and phonation normal.  Cardiovascular: Normal rate and regular rhythm.  Pulmonary/Chest: Effort normal. No stridor. No respiratory distress.  Abdominal: Alan Kennedy exhibits no distension.  Musculoskeletal: Normal range of motion. Alan Kennedy exhibits no edema.       Left ankle: Alan Kennedy exhibits normal range of motion and no swelling. No tenderness. No lateral malleolus and no medial malleolus tenderness found.       Left foot: There is tenderness, bony tenderness and swelling.       Feet:  Neurological: Alan Kennedy is alert and oriented to person, place, and time.  Skin: Alan Kennedy is not diaphoretic.  Psychiatric: Alan Kennedy has a normal mood and affect. His behavior is normal.  Vitals reviewed.   ED Results and Treatments Labs (all labs ordered are listed, but only abnormal results are displayed) Labs Reviewed - No data to display  EKG  EKG Interpretation  Date/Time:    Ventricular Rate:    PR Interval:    QRS Duration:   QT Interval:    QTC Calculation:   R Axis:     Text Interpretation:        Radiology Dg Foot Complete Left  Result Date: 01/15/2018 CLINICAL DATA:  Stepped off  2 inch step wrong 2 days ago; pain in 5th metatarsal at metatarsal/tarsal joint; swollen and red; EXAM: LEFT FOOT - COMPLETE 3+ VIEW COMPARISON:  None. FINDINGS: There is no evidence of fracture or dislocation. There is no evidence of arthropathy or other focal bone abnormality. Soft tissues are unremarkable. IMPRESSION: Negative. Electronically Signed   By: Lucrezia Europe M.D.   On: 01/15/2018 12:47   Pertinent labs & imaging results that were available during my care of the patient were reviewed by me and considered in my medical decision making (see chart for details).  Medications Ordered in ED Medications - No data to display                                                                                                                                  Procedures Procedures  (including critical care time)  Medical Decision Making / ED Course I have reviewed the nursing notes for this encounter and the patient's prior records (if available in EHR or on provided paperwork).    Plain film read negative by Radiology, but there may be a small hairline avulsion fracture at the base of 5th metatarsal which correlates with clinic picture. CAM walker provided. WAT recommended. Ortho follow up.   The patient appears reasonably screened and/or stabilized for discharge and I doubt any other medical condition or other [facility] requiring further screening, evaluation, or treatment in the ED at this time prior to discharge.  The patient is safe for discharge with strict return precautions.   Final Clinical Impression(s) / ED Diagnoses Final diagnoses:  Foot injury, left, initial encounter   Disposition: Discharge  Condition: Good  I have discussed the results, Dx and Tx plan with the patient who expressed understanding and agree(s) with the plan. Discharge instructions discussed at great length. The patient was given strict return precautions who verbalized understanding of the instructions. No further  questions at time of discharge.    ED Discharge Orders        Ordered    acetaminophen (TYLENOL) 500 MG tablet  Every 8 hours     01/15/18 1408    ibuprofen (ADVIL,MOTRIN) 600 MG tablet  Every 6 hours PRN     01/15/18 1408    HYDROcodone-acetaminophen (NORCO/VICODIN) 5-325 MG tablet  Every 8 hours PRN     01/15/18 1408       Follow Up: [place], [place] [id number] [phone]   in 1-2 weeks, For close follow up to assess for possible fifth metatarsal fracture  This chart was dictated using voice recognition software.  Despite best efforts to proofread,  errors can occur which can change the documentation meaning.   Fatima Blank, MD 01/15/18 1721

## 2018-01-19 DIAGNOSIS — M79672 Pain in left foot: Secondary | ICD-10-CM | POA: Diagnosis not present

## 2018-01-19 DIAGNOSIS — L03116 Cellulitis of left lower limb: Secondary | ICD-10-CM | POA: Diagnosis not present

## 2018-01-26 DIAGNOSIS — M25572 Pain in left ankle and joints of left foot: Secondary | ICD-10-CM | POA: Diagnosis not present

## 2018-01-26 DIAGNOSIS — M25562 Pain in left knee: Secondary | ICD-10-CM | POA: Diagnosis not present

## 2018-02-02 DIAGNOSIS — M25572 Pain in left ankle and joints of left foot: Secondary | ICD-10-CM | POA: Diagnosis not present

## 2018-02-16 ENCOUNTER — Other Ambulatory Visit: Payer: Self-pay | Admitting: Orthopedic Surgery

## 2018-02-16 DIAGNOSIS — M25572 Pain in left ankle and joints of left foot: Secondary | ICD-10-CM | POA: Diagnosis not present

## 2018-02-16 DIAGNOSIS — M25512 Pain in left shoulder: Secondary | ICD-10-CM

## 2018-02-24 ENCOUNTER — Other Ambulatory Visit: Payer: Self-pay

## 2018-02-28 ENCOUNTER — Ambulatory Visit
Admission: RE | Admit: 2018-02-28 | Discharge: 2018-02-28 | Disposition: A | Discharge status: Home / Self Care | Payer: 59 | Source: Ambulatory Visit | Attending: Orthopedic Surgery | Admitting: Orthopedic Surgery

## 2018-02-28 DIAGNOSIS — M25512 Pain in left shoulder: Secondary | ICD-10-CM

## 2018-02-28 DIAGNOSIS — S43432A Superior glenoid labrum lesion of left shoulder, initial encounter: Secondary | ICD-10-CM | POA: Diagnosis not present

## 2018-02-28 MED ORDER — IOPAMIDOL (ISOVUE-M 200) INJECTION 41%
12.0000 mL | Freq: Once | INTRAMUSCULAR | Status: AC
  Administered 2018-02-28: 12 mL via INTRA_ARTICULAR

## 2018-03-02 DIAGNOSIS — M19012 Primary osteoarthritis, left shoulder: Secondary | ICD-10-CM | POA: Diagnosis not present

## 2018-03-10 DIAGNOSIS — M24112 Other articular cartilage disorders, left shoulder: Secondary | ICD-10-CM | POA: Diagnosis not present

## 2018-03-10 DIAGNOSIS — G8918 Other acute postprocedural pain: Secondary | ICD-10-CM | POA: Diagnosis not present

## 2018-03-10 DIAGNOSIS — S43492D Other sprain of left shoulder joint, subsequent encounter: Secondary | ICD-10-CM | POA: Diagnosis not present

## 2018-03-10 DIAGNOSIS — M19012 Primary osteoarthritis, left shoulder: Secondary | ICD-10-CM | POA: Diagnosis not present

## 2018-03-10 DIAGNOSIS — S43432A Superior glenoid labrum lesion of left shoulder, initial encounter: Secondary | ICD-10-CM | POA: Diagnosis not present

## 2018-03-10 DIAGNOSIS — M7542 Impingement syndrome of left shoulder: Secondary | ICD-10-CM | POA: Diagnosis not present

## 2018-04-06 ENCOUNTER — Other Ambulatory Visit: Payer: Self-pay | Admitting: Endocrinology

## 2018-04-20 DIAGNOSIS — M25812 Other specified joint disorders, left shoulder: Secondary | ICD-10-CM | POA: Diagnosis not present

## 2018-04-20 DIAGNOSIS — Z01818 Encounter for other preprocedural examination: Secondary | ICD-10-CM | POA: Diagnosis not present

## 2018-04-22 ENCOUNTER — Other Ambulatory Visit: Payer: Self-pay

## 2018-04-22 ENCOUNTER — Encounter (HOSPITAL_COMMUNITY)
Admission: RE | Admit: 2018-04-22 | Discharge: 2018-04-22 | Disposition: A | Discharge status: Home / Self Care | Payer: 59 | Source: Ambulatory Visit | Attending: Orthopedic Surgery | Admitting: Orthopedic Surgery

## 2018-04-22 ENCOUNTER — Encounter (HOSPITAL_COMMUNITY): Payer: Self-pay

## 2018-04-22 DIAGNOSIS — Z01812 Encounter for preprocedural laboratory examination: Secondary | ICD-10-CM | POA: Diagnosis present

## 2018-04-22 HISTORY — DX: Personal history of urinary calculi: Z87.442

## 2018-04-22 HISTORY — DX: Gastro-esophageal reflux disease without esophagitis: K21.9

## 2018-04-22 LAB — CBC
HEMATOCRIT: 45.2 % (ref 39.0–52.0)
HEMOGLOBIN: 14.7 g/dL (ref 13.0–17.0)
MCH: 29.7 pg (ref 26.0–34.0)
MCHC: 32.5 g/dL (ref 30.0–36.0)
MCV: 91.3 fL (ref 78.0–100.0)
Platelets: 311 10*3/uL (ref 150–400)
RBC: 4.95 MIL/uL (ref 4.22–5.81)
RDW: 13.2 % (ref 11.5–15.5)
WBC: 10.6 10*3/uL — AB (ref 4.0–10.5)

## 2018-04-22 LAB — BASIC METABOLIC PANEL
ANION GAP: 9 (ref 5–15)
BUN: 11 mg/dL (ref 6–20)
CHLORIDE: 107 mmol/L (ref 98–111)
CO2: 23 mmol/L (ref 22–32)
Calcium: 9.2 mg/dL (ref 8.9–10.3)
Creatinine, Ser: 0.99 mg/dL (ref 0.61–1.24)
GFR calc non Af Amer: >60 mL/min (ref >60)
Glucose, Bld: 83 mg/dL (ref 70–99)
POTASSIUM: 4 mmol/L (ref 3.5–5.1)
SODIUM: 139 mmol/L (ref 135–145)

## 2018-04-22 LAB — SURGICAL PCR SCREEN
MRSA, PCR: NEGATIVE
STAPHYLOCOCCUS AUREUS: NEGATIVE

## 2018-04-22 NOTE — Progress Notes (Signed)
PCP is Dr. Samara Snide  LOV 03/2018 Did have kidneys stones awhile ago.   Denies murmur, cp, sob.  No cardiac tests done.

## 2018-04-22 NOTE — Pre-Procedure Instructions (Signed)
[address]  04/22/2018      [id number] [address], [place] [address] Phone: [phone] Fax: [phone]    Your procedure is scheduled on Tuesday, August 6th   Report to [facility] Admitting at  5:30AM             (posted surgery time 7:30a - 10:14a)   Call this number if you have problems the morning of surgery:  [phone]   Remember:              4-5 days prior to surgery, STOP TAKING any Vitamins, Herbal Supplements, Anti-inflammatories, Blood Thinners.   Do not eat any food or drink any liquids after midnight Monday.    Take these medicines the morning of surgery with A SIP OF WATER : Zantac    Do not wear jewelry - no rings or watches.  Do not wear lotions, colognes or deodorant.   Men may shave face and neck.  Do not bring valuables to the hospital.  [facility] is not responsible for any belongings or valuables.  Contacts, dentures or bridgework may not be worn into surgery.  Leave your suitcase in the car.  After surgery it may be brought to your room.  For patients admitted to the hospital, discharge time will be determined by your treatment team.  Please read over the following fact sheets that you were given. Pain Booklet, MRSA Information and Surgical Site Infection Prevention       [place]- Preparing For Surgery  Before surgery, you can play an important role. Because skin is not sterile, your skin needs to be as free of germs as possible. You can reduce the number of germs on your skin by washing with CHG (chlorahexidine gluconate) Soap before surgery.  CHG is an antiseptic cleaner which kills germs and bonds with the skin to continue killing germs even after washing.    Oral Hygiene is also important to reduce your risk of infection.    Remember - BRUSH YOUR TEETH THE MORNING OF SURGERY WITH YOUR REGULAR TOOTHPASTE  Please do not use if you have an allergy to CHG or  antibacterial soaps. If your skin becomes reddened/irritated stop using the CHG.  Do not shave (including legs and underarms) for at least 48 hours prior to first CHG shower. It is OK to shave your face.  Please follow these instructions carefully.   1. Shower the NIGHT BEFORE SURGERY and the MORNING OF SURGERY with CHG.   2. If you chose to wash your hair, wash your hair first as usual with your normal shampoo.  3. After you shampoo, rinse your hair and body thoroughly to remove the shampoo.  4. Use CHG as you would any other liquid soap. You can apply CHG directly to the skin and wash gently with a scrungie or a clean washcloth.   5. Apply the CHG Soap to your body ONLY FROM THE NECK DOWN.  Do not use on open wounds or open sores. Avoid contact with your eyes, ears, mouth and genitals (private parts). Wash Face and genitals (private parts)  with your normal soap.  6. Wash thoroughly, paying special attention to the area where your surgery will be performed.  7. Thoroughly rinse your body with warm water from the neck down.  8. DO NOT shower/wash with your normal soap after using and rinsing off the CHG Soap.  9. Pat yourself dry with  a CLEAN TOWEL.  10. Wear CLEAN PAJAMAS to bed the night before surgery, wear comfortable clothes the morning of surgery  11. Place CLEAN SHEETS on your bed the night of your first shower and DO NOT SLEEP WITH PETS.  Day of Surgery:  Do not apply any deodorants/lotions.  Please wear clean clothes to the hospital/surgery center.    Remember to brush your teeth WITH YOUR REGULAR TOOTHPASTE.

## 2018-05-02 MED ORDER — TRANEXAMIC ACID 1000 MG/10ML IV SOLN
1000.0000 mg | INTRAVENOUS | Status: AC
  Administered 2018-05-03: 1000 mg via INTRAVENOUS
  Filled 2018-05-02: qty 1100

## 2018-05-02 MED ORDER — GABAPENTIN 300 MG PO CAPS
300.0000 mg | ORAL_CAPSULE | Freq: Once | ORAL | Status: AC
  Administered 2018-05-03: 300 mg via ORAL
  Filled 2018-05-02: qty 1

## 2018-05-02 MED ORDER — ACETAMINOPHEN 500 MG PO TABS
1000.0000 mg | ORAL_TABLET | Freq: Once | ORAL | Status: AC
  Administered 2018-05-03: 1000 mg via ORAL
  Filled 2018-05-02: qty 2

## 2018-05-02 MED ORDER — DEXAMETHASONE SODIUM PHOSPHATE 10 MG/ML IJ SOLN
8.0000 mg | Freq: Once | INTRAMUSCULAR | Status: AC
  Administered 2018-05-03: 8 mg via INTRAVENOUS
  Filled 2018-05-02: qty 1

## 2018-05-02 MED ORDER — CEFAZOLIN SODIUM-DEXTROSE 2-4 GM/100ML-% IV SOLN
2.0000 g | INTRAVENOUS | Status: AC
  Administered 2018-05-03: 2 g via INTRAVENOUS
  Filled 2018-05-02: qty 100

## 2018-05-03 ENCOUNTER — Encounter (HOSPITAL_COMMUNITY): Payer: Self-pay | Admitting: Anesthesiology

## 2018-05-03 ENCOUNTER — Inpatient Hospital Stay (HOSPITAL_COMMUNITY): Payer: 59

## 2018-05-03 ENCOUNTER — Encounter (HOSPITAL_COMMUNITY)
Admission: RE | Disposition: A | Discharge status: Home / Self Care | Payer: Self-pay | Source: Home / Self Care | Attending: Orthopedic Surgery

## 2018-05-03 ENCOUNTER — Inpatient Hospital Stay (HOSPITAL_COMMUNITY)
Admission: RE | Admit: 2018-05-03 | Discharge: 2018-05-04 | DRG: 483 | Disposition: A | Discharge status: Home / Self Care | Payer: 59 | Attending: Orthopedic Surgery | Admitting: Orthopedic Surgery

## 2018-05-03 ENCOUNTER — Inpatient Hospital Stay (HOSPITAL_COMMUNITY): Payer: 59 | Admitting: Anesthesiology

## 2018-05-03 DIAGNOSIS — K219 Gastro-esophageal reflux disease without esophagitis: Secondary | ICD-10-CM | POA: Diagnosis present

## 2018-05-03 DIAGNOSIS — M19011 Primary osteoarthritis, right shoulder: Secondary | ICD-10-CM | POA: Diagnosis not present

## 2018-05-03 DIAGNOSIS — M19012 Primary osteoarthritis, left shoulder: Secondary | ICD-10-CM | POA: Diagnosis not present

## 2018-05-03 DIAGNOSIS — Z96612 Presence of left artificial shoulder joint: Secondary | ICD-10-CM | POA: Diagnosis not present

## 2018-05-03 DIAGNOSIS — M19019 Primary osteoarthritis, unspecified shoulder: Secondary | ICD-10-CM | POA: Diagnosis present

## 2018-05-03 DIAGNOSIS — Z87442 Personal history of urinary calculi: Secondary | ICD-10-CM | POA: Diagnosis not present

## 2018-05-03 DIAGNOSIS — G8918 Other acute postprocedural pain: Secondary | ICD-10-CM | POA: Diagnosis not present

## 2018-05-03 DIAGNOSIS — Z96652 Presence of left artificial knee joint: Secondary | ICD-10-CM | POA: Diagnosis present

## 2018-05-03 DIAGNOSIS — Z96619 Presence of unspecified artificial shoulder joint: Secondary | ICD-10-CM

## 2018-05-03 DIAGNOSIS — Z471 Aftercare following joint replacement surgery: Secondary | ICD-10-CM | POA: Diagnosis not present

## 2018-05-03 HISTORY — DX: Primary osteoarthritis, left shoulder: M19.012

## 2018-05-03 HISTORY — PX: TOTAL SHOULDER ARTHROPLASTY: SHX126

## 2018-05-03 SURGERY — ARTHROPLASTY, SHOULDER, TOTAL
Anesthesia: General | Site: Shoulder | Laterality: Left

## 2018-05-03 MED ORDER — ROCURONIUM BROMIDE 10 MG/ML (PF) SYRINGE
PREFILLED_SYRINGE | INTRAVENOUS | Status: AC
  Filled 2018-05-03: qty 10

## 2018-05-03 MED ORDER — OXYCODONE-ACETAMINOPHEN 10-325 MG PO TABS: 1.0000 | ORAL_TABLET | Freq: Three times a day (TID) | ORAL | 0 refills | Status: DC | PRN

## 2018-05-03 MED ORDER — ONDANSETRON HCL 4 MG PO TABS: 4.0000 mg | ORAL_TABLET | Freq: Four times a day (QID) | ORAL | Status: DC | PRN

## 2018-05-03 MED ORDER — ALUM & MAG HYDROXIDE-SIMETH 200-200-20 MG/5ML PO SUSP: 30.0000 mL | ORAL | Status: DC | PRN

## 2018-05-03 MED ORDER — HYDROMORPHONE HCL 1 MG/ML IJ SOLN
0.2500 mg | INTRAMUSCULAR | Status: DC | PRN
  Administered 2018-05-03 (×2): 0.5 mg via INTRAVENOUS

## 2018-05-03 MED ORDER — ONDANSETRON HCL 4 MG/2ML IJ SOLN: 4.0000 mg | Freq: Four times a day (QID) | INTRAMUSCULAR | Status: DC | PRN

## 2018-05-03 MED ORDER — SODIUM CHLORIDE 0.9 % IV SOLN: INTRAVENOUS | Status: DC

## 2018-05-03 MED ORDER — TRAZODONE HCL 100 MG PO TABS
100.0000 mg | ORAL_TABLET | Freq: Every evening | ORAL | Status: DC | PRN
  Filled 2018-05-03: qty 1

## 2018-05-03 MED ORDER — MIDAZOLAM HCL 2 MG/2ML IJ SOLN
INTRAMUSCULAR | Status: AC
  Filled 2018-05-03: qty 2

## 2018-05-03 MED ORDER — PROPOFOL 10 MG/ML IV BOLUS
INTRAVENOUS | Status: AC
  Filled 2018-05-03: qty 20

## 2018-05-03 MED ORDER — KETOROLAC TROMETHAMINE 15 MG/ML IJ SOLN
INTRAMUSCULAR | Status: AC
  Filled 2018-05-03: qty 1

## 2018-05-03 MED ORDER — PROPOFOL 10 MG/ML IV BOLUS
INTRAVENOUS | Status: DC | PRN
  Administered 2018-05-03: 110 mg via INTRAVENOUS

## 2018-05-03 MED ORDER — ACETAMINOPHEN 325 MG PO TABS: 325.0000 mg | ORAL_TABLET | Freq: Four times a day (QID) | ORAL | Status: DC | PRN

## 2018-05-03 MED ORDER — KETOROLAC TROMETHAMINE 15 MG/ML IJ SOLN
7.5000 mg | Freq: Four times a day (QID) | INTRAMUSCULAR | Status: AC
  Administered 2018-05-03 – 2018-05-04 (×4): 7.5 mg via INTRAVENOUS
  Filled 2018-05-03 (×3): qty 1

## 2018-05-03 MED ORDER — METOCLOPRAMIDE HCL 5 MG PO TABS: 5.0000 mg | ORAL_TABLET | Freq: Three times a day (TID) | ORAL | Status: DC | PRN

## 2018-05-03 MED ORDER — SCOPOLAMINE 1 MG/3DAYS TD PT72
MEDICATED_PATCH | TRANSDERMAL | Status: AC
  Filled 2018-05-03: qty 1

## 2018-05-03 MED ORDER — OXYCODONE HCL 5 MG PO TABS
5.0000 mg | ORAL_TABLET | ORAL | Status: DC | PRN
  Administered 2018-05-03 – 2018-05-04 (×5): 10 mg via ORAL
  Filled 2018-05-03 (×4): qty 2

## 2018-05-03 MED ORDER — MAGNESIUM CITRATE PO SOLN: 1.0000 | Freq: Once | ORAL | Status: DC | PRN

## 2018-05-03 MED ORDER — FAMOTIDINE 20 MG PO TABS: 20.0000 mg | ORAL_TABLET | Freq: Every day | ORAL | Status: DC

## 2018-05-03 MED ORDER — SCOPOLAMINE 1 MG/3DAYS TD PT72
MEDICATED_PATCH | TRANSDERMAL | Status: DC | PRN
  Administered 2018-05-03: 1 via TRANSDERMAL

## 2018-05-03 MED ORDER — OMEGA-3-ACID ETHYL ESTERS 1 G PO CAPS
1.0000 g | ORAL_CAPSULE | Freq: Every day | ORAL | Status: DC
  Filled 2018-05-03: qty 1

## 2018-05-03 MED ORDER — TESTOSTERONE 20.25 MG/ACT (1.62%) TD GEL: 1.0000 | Freq: Every day | TRANSDERMAL | Status: DC

## 2018-05-03 MED ORDER — ONDANSETRON HCL 4 MG/2ML IJ SOLN: 4.0000 mg | Freq: Once | INTRAMUSCULAR | Status: DC | PRN

## 2018-05-03 MED ORDER — POLYETHYLENE GLYCOL 3350 17 G PO PACK: 17.0000 g | PACK | Freq: Every day | ORAL | Status: DC | PRN

## 2018-05-03 MED ORDER — METHOCARBAMOL 500 MG PO TABS
500.0000 mg | ORAL_TABLET | Freq: Four times a day (QID) | ORAL | Status: DC | PRN
  Administered 2018-05-03 – 2018-05-04 (×3): 500 mg via ORAL
  Filled 2018-05-03 (×2): qty 1

## 2018-05-03 MED ORDER — LIDOCAINE HCL (CARDIAC) PF 100 MG/5ML IV SOSY
PREFILLED_SYRINGE | INTRAVENOUS | Status: DC | PRN
  Administered 2018-05-03: 100 mg via INTRAVENOUS

## 2018-05-03 MED ORDER — METOCLOPRAMIDE HCL 5 MG/ML IJ SOLN: 5.0000 mg | Freq: Three times a day (TID) | INTRAMUSCULAR | Status: DC | PRN

## 2018-05-03 MED ORDER — OXYCODONE HCL 5 MG PO TABS: 10.0000 mg | ORAL_TABLET | ORAL | Status: DC | PRN

## 2018-05-03 MED ORDER — ACETAMINOPHEN 500 MG PO TABS
1000.0000 mg | ORAL_TABLET | Freq: Four times a day (QID) | ORAL | Status: AC
  Administered 2018-05-03 – 2018-05-04 (×4): 1000 mg via ORAL
  Filled 2018-05-03 (×4): qty 2

## 2018-05-03 MED ORDER — SODIUM CHLORIDE 0.9 % IV SOLN
INTRAVENOUS | Status: DC | PRN
  Administered 2018-05-03: 25 ug/min via INTRAVENOUS

## 2018-05-03 MED ORDER — MENTHOL 3 MG MT LOZG: 1.0000 | LOZENGE | OROMUCOSAL | Status: DC | PRN

## 2018-05-03 MED ORDER — ONDANSETRON HCL 4 MG PO TABS: 4.0000 mg | ORAL_TABLET | Freq: Three times a day (TID) | ORAL | 0 refills | Status: DC | PRN

## 2018-05-03 MED ORDER — BACLOFEN 10 MG PO TABS: 10.0000 mg | ORAL_TABLET | Freq: Three times a day (TID) | ORAL | 0 refills | Status: DC

## 2018-05-03 MED ORDER — MIDAZOLAM HCL 5 MG/5ML IJ SOLN
INTRAMUSCULAR | Status: DC | PRN
  Administered 2018-05-03: 2 mg via INTRAVENOUS

## 2018-05-03 MED ORDER — ONDANSETRON HCL 4 MG/2ML IJ SOLN
INTRAMUSCULAR | Status: DC | PRN
  Administered 2018-05-03: 4 mg via INTRAVENOUS

## 2018-05-03 MED ORDER — LACTATED RINGERS IV SOLN
INTRAVENOUS | Status: DC | PRN
  Administered 2018-05-03: 07:00:00 via INTRAVENOUS

## 2018-05-03 MED ORDER — DOCUSATE SODIUM 100 MG PO CAPS
100.0000 mg | ORAL_CAPSULE | Freq: Two times a day (BID) | ORAL | Status: DC
  Administered 2018-05-03: 100 mg via ORAL
  Filled 2018-05-03: qty 1

## 2018-05-03 MED ORDER — BUPIVACAINE HCL (PF) 0.25 % IJ SOLN
INTRAMUSCULAR | Status: AC
  Filled 2018-05-03: qty 30

## 2018-05-03 MED ORDER — EPHEDRINE SULFATE-NACL 50-0.9 MG/10ML-% IV SOSY
PREFILLED_SYRINGE | INTRAVENOUS | Status: DC | PRN
  Administered 2018-05-03 (×2): 5 mg via INTRAVENOUS
  Administered 2018-05-03 (×2): 10 mg via INTRAVENOUS

## 2018-05-03 MED ORDER — METHOCARBAMOL 500 MG PO TABS
ORAL_TABLET | ORAL | Status: AC
  Filled 2018-05-03: qty 1

## 2018-05-03 MED ORDER — PHENOL 1.4 % MT LIQD: 1.0000 | OROMUCOSAL | Status: DC | PRN

## 2018-05-03 MED ORDER — OXYCODONE HCL 5 MG PO TABS
ORAL_TABLET | ORAL | Status: AC
  Filled 2018-05-03: qty 2

## 2018-05-03 MED ORDER — BUPIVACAINE-EPINEPHRINE (PF) 0.5% -1:200000 IJ SOLN
INTRAMUSCULAR | Status: DC | PRN
  Administered 2018-05-03: 20 mL via PERINEURAL

## 2018-05-03 MED ORDER — SODIUM CHLORIDE 0.9 % IR SOLN
Status: DC | PRN
  Administered 2018-05-03: 1000 mL

## 2018-05-03 MED ORDER — HYDROMORPHONE HCL 1 MG/ML IJ SOLN
INTRAMUSCULAR | Status: AC
  Filled 2018-05-03: qty 1

## 2018-05-03 MED ORDER — SENNA-DOCUSATE SODIUM 8.6-50 MG PO TABS: 2.0000 | ORAL_TABLET | Freq: Every day | ORAL | 1 refills | Status: AC

## 2018-05-03 MED ORDER — MEPERIDINE HCL 50 MG/ML IJ SOLN
6.2500 mg | INTRAMUSCULAR | Status: DC | PRN
Start: 2018-05-03 — End: 2018-05-03

## 2018-05-03 MED ORDER — FENTANYL CITRATE (PF) 100 MCG/2ML IJ SOLN
INTRAMUSCULAR | Status: DC | PRN
  Administered 2018-05-03: 50 ug via INTRAVENOUS
  Administered 2018-05-03: 100 ug via INTRAVENOUS

## 2018-05-03 MED ORDER — ZOLPIDEM TARTRATE 5 MG PO TABS: 5.0000 mg | ORAL_TABLET | Freq: Every evening | ORAL | Status: DC | PRN

## 2018-05-03 MED ORDER — BUPIVACAINE LIPOSOME 1.3 % IJ SUSP
INTRAMUSCULAR | Status: DC | PRN
  Administered 2018-05-03: 10 mL via PERINEURAL

## 2018-05-03 MED ORDER — ATORVASTATIN CALCIUM 20 MG PO TABS
20.0000 mg | ORAL_TABLET | Freq: Every day | ORAL | Status: DC
  Administered 2018-05-03: 20 mg via ORAL
  Filled 2018-05-03: qty 1

## 2018-05-03 MED ORDER — SUGAMMADEX SODIUM 200 MG/2ML IV SOLN
INTRAVENOUS | Status: DC | PRN
Start: 2018-05-03 — End: 2018-05-03
  Administered 2018-05-03: 200 mg via INTRAVENOUS

## 2018-05-03 MED ORDER — 0.9 % SODIUM CHLORIDE (POUR BTL) OPTIME
TOPICAL | Status: DC | PRN
  Administered 2018-05-03: 1000 mL

## 2018-05-03 MED ORDER — FENTANYL CITRATE (PF) 250 MCG/5ML IJ SOLN
INTRAMUSCULAR | Status: AC
  Filled 2018-05-03: qty 5

## 2018-05-03 MED ORDER — ROCURONIUM BROMIDE 100 MG/10ML IV SOLN
INTRAVENOUS | Status: DC | PRN
  Administered 2018-05-03: 50 mg via INTRAVENOUS

## 2018-05-03 MED ORDER — ICOSAPENT ETHYL 1 G PO CAPS: 2.0000 g | ORAL_CAPSULE | Freq: Every day | ORAL | Status: DC

## 2018-05-03 MED ORDER — HYDROMORPHONE HCL 1 MG/ML IJ SOLN
0.5000 mg | INTRAMUSCULAR | Status: DC | PRN
  Administered 2018-05-03: 1 mg via INTRAVENOUS
  Filled 2018-05-03: qty 1

## 2018-05-03 MED ORDER — BISACODYL 10 MG RE SUPP: 10.0000 mg | Freq: Every day | RECTAL | Status: DC | PRN

## 2018-05-03 MED ORDER — CEFAZOLIN SODIUM-DEXTROSE 1-4 GM/50ML-% IV SOLN
1.0000 g | Freq: Four times a day (QID) | INTRAVENOUS | Status: AC
  Administered 2018-05-03 – 2018-05-04 (×3): 1 g via INTRAVENOUS
  Filled 2018-05-03 (×3): qty 50

## 2018-05-03 MED ORDER — LIDOCAINE 2% (20 MG/ML) 5 ML SYRINGE
INTRAMUSCULAR | Status: AC
  Filled 2018-05-03: qty 5

## 2018-05-03 MED ORDER — METHOCARBAMOL 1000 MG/10ML IJ SOLN
500.0000 mg | Freq: Four times a day (QID) | INTRAMUSCULAR | Status: DC | PRN
  Filled 2018-05-03: qty 5

## 2018-05-03 MED ORDER — BUPIVACAINE HCL (PF) 0.25 % IJ SOLN: INTRAMUSCULAR | Status: DC | PRN

## 2018-05-03 MED ORDER — ONDANSETRON HCL 4 MG/2ML IJ SOLN
INTRAMUSCULAR | Status: AC
  Filled 2018-05-03: qty 2

## 2018-05-03 MED ORDER — POTASSIUM CHLORIDE IN NACL 20-0.45 MEQ/L-% IV SOLN
INTRAVENOUS | Status: DC
  Filled 2018-05-03: qty 1000

## 2018-05-03 MED ORDER — DIPHENHYDRAMINE HCL 12.5 MG/5ML PO ELIX: 12.5000 mg | ORAL_SOLUTION | ORAL | Status: DC | PRN

## 2018-05-03 SURGICAL SUPPLY — 60 items
ADPR HD STD TPR HUM TI RVRS (Orthopedic Implant) ×1 IMPLANT
BIT DRILL 5/64X5 DISP (BIT) ×1 IMPLANT
BIT DRILL 7/64X5 DISP (BIT) IMPLANT
BLADE SAW SGTL MED 73X18.5 STR (BLADE) ×2 IMPLANT
CEMENT BONE R 1X40 (Cement) ×2 IMPLANT
CLSR STERI-STRIP ANTIMIC 1/2X4 (GAUZE/BANDAGES/DRESSINGS) ×2 IMPLANT
COVER SURGICAL LIGHT HANDLE (MISCELLANEOUS) ×2 IMPLANT
DRAPE HALF SHEET 40X57 (DRAPES) ×2 IMPLANT
DRAPE ORTHO SPLIT 77X108 STRL (DRAPES) ×4
DRAPE SURG ORHT 6 SPLT 77X108 (DRAPES) ×2 IMPLANT
DRAPE U-SHAPE 47X51 STRL (DRAPES) ×2 IMPLANT
DRSG MEPILEX BORDER 4X8 (GAUZE/BANDAGES/DRESSINGS) ×2 IMPLANT
DURAPREP 26ML APPLICATOR (WOUND CARE) ×2 IMPLANT
ELECT REM PT RETURN 9FT ADLT (ELECTROSURGICAL) ×2
ELECTRODE REM PT RTRN 9FT ADLT (ELECTROSURGICAL) ×1 IMPLANT
GLENOID PEGGED STRL MED 4MM (Orthopedic Implant) ×1 IMPLANT
GLENOID POST REGENREX HYBRID (Orthopedic Implant) ×1 IMPLANT
GLOVE BIOGEL PI ORTHO PRO SZ8 (GLOVE) ×2
GLOVE ORTHO TXT STRL SZ7.5 (GLOVE) ×2 IMPLANT
GLOVE PI ORTHO PRO STRL SZ8 (GLOVE) ×2 IMPLANT
GLOVE SURG ORTHO 8.0 STRL STRW (GLOVE) ×2 IMPLANT
GOWN STRL REUS W/ TWL XL LVL3 (GOWN DISPOSABLE) ×1 IMPLANT
GOWN STRL REUS W/TWL 2XL LVL3 (GOWN DISPOSABLE) ×2 IMPLANT
GOWN STRL REUS W/TWL XL LVL3 (GOWN DISPOSABLE) ×2
HANDPIECE INTERPULSE COAX TIP (DISPOSABLE) ×2
HEAD HUMERAL BIPOLAR 52X24X50 (Miscellaneous) IMPLANT
HEAD HUMERAL COMP STD (Orthopedic Implant) IMPLANT
HOOD PEEL AWAY FACE SHEILD DIS (HOOD) ×2 IMPLANT
HOOD PEEL AWAY FLYTE STAYCOOL (MISCELLANEOUS) ×2 IMPLANT
HUMERAL HEAD BIPOLAR 52X24X50 (Miscellaneous) ×2 IMPLANT
HUMERAL HEAD COMP STD (Orthopedic Implant) ×2 IMPLANT
KIT BASIN OR (CUSTOM PROCEDURE TRAY) ×2 IMPLANT
KIT TURNOVER KIT B (KITS) ×2 IMPLANT
MANIFOLD NEPTUNE II (INSTRUMENTS) ×2 IMPLANT
NDL SUT 6 .5 CRC .975X.05 MAYO (NEEDLE) IMPLANT
NEEDLE MAYO TAPER (NEEDLE) ×2
NS IRRIG 1000ML POUR BTL (IV SOLUTION) ×2 IMPLANT
PACK SHOULDER (CUSTOM PROCEDURE TRAY) ×2 IMPLANT
PAD ARMBOARD 7.5X6 YLW CONV (MISCELLANEOUS) ×4 IMPLANT
PIN HUMERAL STMN 3.2MMX9IN (INSTRUMENTS) ×1 IMPLANT
SET HNDPC FAN SPRY TIP SCT (DISPOSABLE) ×1 IMPLANT
SLING ARM IMMOBILIZER LRG (SOFTGOODS) ×1 IMPLANT
SLING ARM IMMOBILIZER MED (SOFTGOODS) ×1 IMPLANT
SMARTMIX MINI TOWER (MISCELLANEOUS) ×2
SPONGE LAP 18X18 X RAY DECT (DISPOSABLE) ×4 IMPLANT
STEM HUMERAL STRL 11MMX55MM (Stem) ×1 IMPLANT
SUCTION FRAZIER HANDLE 10FR (MISCELLANEOUS) ×1
SUCTION TUBE FRAZIER 10FR DISP (MISCELLANEOUS) ×1 IMPLANT
SUPPORT WRAP ARM LG (MISCELLANEOUS) ×2 IMPLANT
SUT FIBERWIRE #2 38 REV NDL BL (SUTURE) ×10
SUT VIC AB 0 CT1 27 (SUTURE) ×2
SUT VIC AB 0 CT1 27XBRD ANBCTR (SUTURE) ×1 IMPLANT
SUT VIC AB 2-0 CT1 27 (SUTURE) ×2
SUT VIC AB 2-0 CT1 TAPERPNT 27 (SUTURE) ×1 IMPLANT
SUT VIC AB 3-0 SH 8-18 (SUTURE) ×2 IMPLANT
SUTURE FIBERWR#2 38 REV NDL BL (SUTURE) ×5 IMPLANT
TOWEL OR 17X26 10 PK STRL BLUE (TOWEL DISPOSABLE) ×2 IMPLANT
TOWER SMARTMIX MINI (MISCELLANEOUS) ×1 IMPLANT
TUBE CONNECTING 12X1/4 (SUCTIONS) IMPLANT
YANKAUER SUCT BULB TIP NO VENT (SUCTIONS) IMPLANT

## 2018-05-03 NOTE — Anesthesia Procedure Notes (Signed)
Anesthesia Regional Block: Interscalene brachial plexus block   Pre-Anesthetic Checklist: ,, timeout performed, Correct Patient, Correct Site, Correct Laterality, Correct Procedure, Correct Position, site marked, Risks and benefits discussed,  Surgical consent,  Pre-op evaluation,  At surgeon's request and post-op pain management  Laterality: Left  Prep: chloraprep       Needles:  Injection technique: Single-shot  Needle Type: Other     Needle Length: 9cm  Needle Gauge: 22   Needle insertion depth: 5 cm   Additional Needles:   Procedures:, nerve stimulator,,,,,,,  Narrative:  Start time: 05/03/2018 7:10 AM End time: 05/03/2018 7:20 AM Injection made incrementally with aspirations every 5 mL.  Performed by: Personally  Anesthesiologist: Lillia Abed, MD  Additional Notes: Monitors applied. Patient sedated. Sterile prep and drape,hand hygiene and sterile gloves were used. Relevant anatomy identified.Needle position confirmed.Local anesthetic injected incrementally after negative aspiration. Local anesthetic spread visualized around nerve(s). Vascular puncture avoided. No complications. Image printed for medical record.The patient tolerated the procedure well.

## 2018-05-03 NOTE — Anesthesia Procedure Notes (Signed)
Procedure Name: Intubation Date/Time: 05/03/2018 7:41 AM Performed by: Kyung Rudd, CRNA Pre-anesthesia Checklist: Patient identified, Emergency Drugs available, Suction available, Patient being monitored and Timeout performed Patient Re-evaluated:Patient Re-evaluated prior to induction Oxygen Delivery Method: Circle system utilized Preoxygenation: Pre-oxygenation with 100% oxygen Induction Type: IV induction Ventilation: Mask ventilation without difficulty Laryngoscope Size: Mac and 4 Grade View: Grade I Tube type: Oral Tube size: 7.5 mm Number of attempts: 1 Airway Equipment and Method: Stylet Placement Confirmation: ETT inserted through vocal cords under direct vision,  positive ETCO2 and breath sounds checked- equal and bilateral Secured at: 22 cm Tube secured with: Tape Dental Injury: Teeth and Oropharynx as per pre-operative assessment

## 2018-05-03 NOTE — Anesthesia Postprocedure Evaluation (Signed)
Anesthesia Post Note  Patient: Alan Kennedy  Procedure(s) Performed: LEFT TOTAL SHOULDER ARTHROPLASTY (Left Shoulder)     Patient location during evaluation: PACU Anesthesia Type: General Level of consciousness: awake and alert Pain management: pain level controlled Vital Signs Assessment: post-procedure vital signs reviewed and stable Respiratory status: spontaneous breathing, nonlabored ventilation, respiratory function stable and patient connected to nasal cannula oxygen Cardiovascular status: blood pressure returned to baseline and stable Postop Assessment: no apparent nausea or vomiting Anesthetic complications: no    Last Vitals:  Vitals:   05/03/18 1115 05/03/18 1158  BP: 125/68 131/73  Pulse: 78 91  Resp: 15 18  Temp:  36.9 C  SpO2: 92% 91%    Last Pain:  Vitals:   05/03/18 1158  TempSrc: Oral  PainSc:                  Cystal Shannahan DAVID

## 2018-05-03 NOTE — Progress Notes (Signed)
Orthopedic Tech Progress Note Patient Details:  Alan Kennedy [birth year] [id number]  Ortho Devices Type of Ortho Device: Shoulder immobilizer       [name] 05/03/2018, 11:31 AM

## 2018-05-03 NOTE — Anesthesia Preprocedure Evaluation (Addendum)
Anesthesia Evaluation  Patient identified by MRN, date of birth, ID band Patient awake    Reviewed: Allergy & Precautions, NPO status , Patient's Chart, lab work & pertinent test results  Airway Mallampati: I  TM Distance: >3 FB Neck ROM: Full    Dental  (+) Edentulous Upper, Edentulous Lower, Dental Advisory Given   Pulmonary    Pulmonary exam normal        Cardiovascular Normal cardiovascular exam     Neuro/Psych Depression    GI/Hepatic GERD  Medicated and Controlled,  Endo/Other    Renal/GU      Musculoskeletal   Abdominal   Peds  Hematology   Anesthesia Other Findings   Reproductive/Obstetrics                            Anesthesia Physical Anesthesia Plan  ASA: II  Anesthesia Plan: General   Post-op Pain Management:  Regional for Post-op pain   Induction:   PONV Risk Score and Plan: 2 and Midazolam and Ondansetron  Airway Management Planned: Oral ETT  Additional Equipment:   Intra-op Plan:   Post-operative Plan: Extubation in OR  Informed Consent: I have reviewed the patients History and Physical, chart, labs and discussed the procedure including the risks, benefits and alternatives for the proposed anesthesia with the patient or authorized representative who has indicated his/her understanding and acceptance.     Plan Discussed with: CRNA and Surgeon  Anesthesia Plan Comments:         Anesthesia Quick Evaluation

## 2018-05-03 NOTE — H&P (Signed)
PREOPERATIVE H&P  Chief Complaint: left shoulder pain  HPI: Alan Kennedy is a 57 y.o. male who presents for preoperative history and physical with a diagnosis of left shoulder OA. Symptoms are rated as moderate to severe, and have been worsening.  This is significantly impairing activities of daily living.  He has elected for surgical management.   He has failed injections, activity modification, anti-inflammatories, arthroscopy, and assistive devices.  Scope images showed grade 4 chondral change on the humerus and glenoid.   Past Medical History:  Diagnosis Date  . Arthritis    knees  . Depression   . GERD (gastroesophageal reflux disease)    takes OTC  . Gout   . History of kidney stones   . Osteoarthritis of left knee, medial 11/14/2013   Past Surgical History:  Procedure Laterality Date  . BACK SURGERY     x3  . HAND RECONSTRUCTION Left   . JOINT REPLACEMENT    . KNEE ARTHROSCOPY     30% cartiledge removal, 2x's on the R, multiple times on the L   . KNEE SURGERY Left 11/14/2013   DR Janielle Mittelstadt            UNICOMPARTMENTAL   . NOSE SURGERY  2000   reconstruction   . PARTIAL KNEE ARTHROPLASTY Left 11/14/2013   Procedure: UNICOMPARTMENTAL LEFT KNEE;  Surgeon: Johnny Bridge, MD;  Location: [facility];  Service: Orthopedics;  Laterality: Left;  . SHOULDER SURGERY Right    arthroscopy  . SPINAL CORD STIMULATOR IMPLANT  2009   Social History   Socioeconomic History  . Marital status: Married    Spouse name: Not on file  . Number of children: Not on file  . Years of education: Not on file  . Highest education level: Not on file  Occupational History  . Not on file  Social Needs  . Financial resource strain: Not on file  . Food insecurity:    Worry: Not on file    Inability: Not on file  . Transportation needs:    Medical: Not on file    Non-medical: Not on file  Tobacco Use  . Smoking status: Never Smoker  . Smokeless tobacco: Never Used  Substance and Sexual  Activity  . Alcohol use: No  . Drug use: No  . Sexual activity: Not on file  Lifestyle  . Physical activity:    Days per week: Not on file    Minutes per session: Not on file  . Stress: Not on file  Relationships  . Social connections:    Talks on phone: Not on file    Gets together: Not on file    Attends religious service: Not on file    Active member of club or organization: Not on file    Attends meetings of clubs or organizations: Not on file    Relationship status: Not on file  Other Topics Concern  . Not on file  Social History Narrative  . Not on file   Family History  Family history unknown: Yes   No Known Allergies Prior to Admission medications   Medication Sig Start Date End Date Taking? Authorizing Provider  acetaminophen (TYLENOL) 500 MG tablet Take 1,000 mg by mouth 3 (three) times daily as needed for moderate pain.   Yes [provider]  atorvastatin (LIPITOR) 20 MG tablet Take 20 mg by mouth daily.  04/27/17  Yes [provider]  clomiPHENE (CLOMID) 50 MG tablet TAKE 1/2 TABLET 3 TIMES WEEKLY  Patient taking differently: Take 25 mg by mouth once daily on Mon, Wed, and Fri 04/06/18  Yes Elayne Snare, MD  diphenhydrAMINE (BENADRYL) 25 MG tablet Take 12.5-25 mg by mouth daily as needed for allergies.   Yes [provider]  ibuprofen (ADVIL,MOTRIN) 200 MG tablet Take 400-600 mg by mouth 3 (three) times daily as needed for moderate pain.   Yes [provider]  Multiple Vitamins-Minerals (MULTIVITAMIN WITH MINERALS) tablet Take 1 tablet by mouth daily.   Yes [provider]  Nutritional Supplements (JUICE PLUS FIBRE PO) Take 2 tablets by mouth daily. Juice plus veggie   Yes [provider]  Nutritional Supplements (JUICE PLUS FIBRE PO) Take 2 tablets by mouth daily. Juice plus fruit   Yes [provider]  OVER THE COUNTER MEDICATION Place 1 Dose under the tongue 2 (two) times daily. CBD oil   Yes [provider]  ranitidine (ZANTAC) 150 MG tablet Take 150 mg by mouth daily as needed for heartburn.   Yes [provider]  Testosterone 20.25 MG/ACT (1.62%) GEL Apply 1 Pump topically daily. 04/04/18  Yes [provider]  traZODone (DESYREL) 100 MG tablet Take 100 mg by mouth at bedtime as needed for sleep.   Yes [provider]  VASCEPA 1 g CAPS Take 2 g by mouth daily.  05/18/17  Yes [provider]  ibuprofen (ADVIL,MOTRIN) 600 MG tablet Take 1 tablet (600 mg total) by mouth every 6 (six) hours as needed. Patient not taking: Reported on 04/18/2018 01/15/18   Fatima Blank, MD     Positive ROS: All other systems have been reviewed and were otherwise negative with the exception of those mentioned in the HPI and as above.  Physical Exam: General: Alert, no acute distress Cardiovascular: No pedal edema Respiratory: No cyanosis, no use of accessory musculature GI: No organomegaly, abdomen is soft and non-tender Skin: No lesions in the area of chief complaint Neurologic: Sensation intact distally Psychiatric: Patient is competent for consent with normal mood and affect Lymphatic: No axillary or cervical lymphadenopathy  MUSCULOSKELETAL: left shoulder AROM 0-80, ER to 0, painful arc throughout  Assessment: Left shoulder osteoarthritis   Plan: Plan for Procedure(s): LEFT TOTAL SHOULDER ARTHROPLASTY  The risks benefits and alternatives were discussed with the patient including but not limited to the risks of nonoperative treatment, versus surgical intervention including infection, bleeding, nerve injury,  blood clots, cardiopulmonary complications, morbidity, mortality, among others, and they were willing to proceed.    Preoperative templating of the joint replacement has been completed, documented, and submitted to the Operating Room personnel in order to optimize intra-operative equipment management.  Johnny Bridge, MD Cell [phone]   05/03/2018 7:19 AM

## 2018-05-03 NOTE — Discharge Instructions (Signed)

## 2018-05-03 NOTE — Transfer of Care (Signed)
Immediate Anesthesia Transfer of Care Note  Patient: Alan Kennedy  Procedure(s) Performed: LEFT TOTAL SHOULDER ARTHROPLASTY (Left Shoulder)  Patient Location: PACU  Anesthesia Type:GA combined with regional for post-op pain  Level of Consciousness: awake, alert  and oriented  Airway & Oxygen Therapy: Patient Spontanous Breathing and Patient connected to nasal cannula oxygen  Post-op Assessment: Report given to RN and Post -op Vital signs reviewed and stable  Post vital signs: Reviewed and stable  Last Vitals:  Vitals Value Taken Time  BP    Temp    Pulse    Resp    SpO2      Last Pain: There were no vitals filed for this visit.       Complications: No apparent anesthesia complications

## 2018-05-03 NOTE — Op Note (Signed)
05/03/2018  9:45 AM  PATIENT:  Alan Kennedy    PRE-OPERATIVE DIAGNOSIS: Left shoulder primary localized osteoarthritis  POST-OPERATIVE DIAGNOSIS:  Same  PROCEDURE: Left total Shoulder Arthroplasty  SURGEON:  Johnny Bridge, MD  PHYSICIAN ASSISTANT: Joya Gaskins, OPA-C, present and scrubbed throughout the case, critical for completion in a timely fashion, and for retraction, instrumentation, and closure.  ANESTHESIA:   General with an interscalene block using Exparel  ESTIMATED BLOOD LOSS: 100 mL  UNIQUE ASPECTS OF THE CASE: The humeral head was quite large, there was really no collapse, the height was fairly substantial, I ended up using a 24 mm head, in order to restore the height.  The stem seated fully, and I had the appropriate gap between the head and the bone.  He had fairly good subscapularis musculature, there is no significant rotator cuff tear, I did have to release just a very small rim of capsular attachment to the head cut posterior a superiorly.  PREOPERATIVE INDICATIONS:  Alan Kennedy is a  57 y.o. male who failed conservative measures as well as arthroscopic debridement and elected for surgical management.    The risks benefits and alternatives were discussed with the patient preoperatively including but not limited to the risks of infection, bleeding, nerve injury, cardiopulmonary complications, the need for revision surgery, dislocation, loosening, incomplete relief of pain, among others, and the patient was willing to proceed.   OPERATIVE IMPLANTS: Biomet size 11 micro press-fit humeral stem, size 52+24 versa-dial humeral head, set in the C position with increased coverage posterior superior, with a medium cemented glenoid polyethylene 3 peg implant with a central regenerex noncemented post.   OPERATIVE FINDINGS: Advanced glenohumeral osteoarthritis involving the glenoid and the humeral head with mild osteophyte formation inferiorly.  There were areas of  grade 4 chondral changes posterior superior on the glenoid, as well as centrally and superiorly on the humeral head.   OPERATIVE PROCEDURE: The patient was brought to the operating room and placed in the supine position. General anesthesia was administered. IV antibiotics were given.  The upper extremity was prepped and draped in usual sterile fashion. The patient was in a beachchair position with all bony prominences padded.   Time out was performed and a deltopectoral approach was carried out. The biceps tendon was tenodesed to the pectoralis tendon. The subscapularis was released, tagging it with a #2 FiberWire, leaving a cuff of tendon for repair.   The inferior osteophyte was removed, and release of the capsule off of the humeral side was completed. The head was dislocated, and I reamed sequentially. I placed the humeral cutting guide at 30 of retroversion, and then pinned this into place, and made my humeral neck cut. This was at the appropriate level.   I then placed deep retractors and exposed the glenoid. I excised the labrum circumferentially, taking care to protect the axillary nerve inferiorly.   I then placed a guidewire into the center position, controlling appropriate version and inclination. I then reamed over the guidewire with the small reamer, and was satisfied with the preparation. I preserved the subchondral bone in order to maximize the strength and minimize the risk for subsequent subsidence.   I then drilled the central hole for the regenerex peg, and then placed the guide, and then drilled the 3 peripheral peg holes. I had excellent bony circumferential contact.  All 3 holes were contained within the vault, the central regenerex post was just barely bicortical.  I then cleaned the glenoid, irrigated  it copiously, and then dried it and cemented the prosthesis into place. Excellent seating was achieved. I had full exposure. The cement cured while I turned my attention to the  humeral side.   I sequentially broached, up to the selected size, with the broach set at 30 of retroversion. I placed 3 #2 Fiberwire through the bone for subsequent repair.  I then placed the real stem. I trialed with multiple heads, and the above-named component was selected. Increased posterior coverage improved the coverage. The soft tissue tension was appropriate.   I then impacted the real humeral head into place, reduced the head, and irrigated copiously. Excellent stability and range of motion was achieved. I repaired the subscapularis with a total of 5 #2 FiberWire; one for the interval, one for the corner, and then the remaining three from the lesser tuberosity which had already been passed.  Excellent repair achieved and I irrigated copiously once more. The subcutaneous tissue was closed with Vicryl including the deltopectoral fascia.   The skin was closed with Steri-Strips and sterile gauze was applied. He had a preoperative nerve block. He tolerated the procedure well and there were no complications.

## 2018-05-04 ENCOUNTER — Encounter (HOSPITAL_COMMUNITY): Payer: Self-pay | Admitting: Orthopedic Surgery

## 2018-05-04 DIAGNOSIS — M19011 Primary osteoarthritis, right shoulder: Secondary | ICD-10-CM | POA: Diagnosis not present

## 2018-05-04 LAB — BASIC METABOLIC PANEL
Anion gap: 9 (ref 5–15)
BUN: 12 mg/dL (ref 6–20)
CO2: 28 mmol/L (ref 22–32)
Calcium: 8.6 mg/dL — ABNORMAL LOW (ref 8.9–10.3)
Chloride: 100 mmol/L (ref 98–111)
Creatinine, Ser: 1.08 mg/dL (ref 0.61–1.24)
GFR calc Af Amer: >60 mL/min (ref >60)
GLUCOSE: 130 mg/dL — AB (ref 70–99)
POTASSIUM: 4.3 mmol/L (ref 3.5–5.1)
Sodium: 137 mmol/L (ref 135–145)

## 2018-05-04 LAB — CBC
HEMATOCRIT: 38.8 % — AB (ref 39.0–52.0)
Hemoglobin: 12.5 g/dL — ABNORMAL LOW (ref 13.0–17.0)
MCH: 29.5 pg (ref 26.0–34.0)
MCHC: 32.2 g/dL (ref 30.0–36.0)
MCV: 91.5 fL (ref 78.0–100.0)
PLATELETS: 269 10*3/uL (ref 150–400)
RBC: 4.24 MIL/uL (ref 4.22–5.81)
RDW: 13.5 % (ref 11.5–15.5)
WBC: 13 10*3/uL — ABNORMAL HIGH (ref 4.0–10.5)

## 2018-05-04 NOTE — Progress Notes (Signed)
Patient alert and oriented, mae's well, voiding adequate amount of urine, swallowing without difficulty,  c/o pain at time of discharge and medication already given with ice pack to shoulder. Patient discharged home with family. Script and discharged instructions given to patient. Patient and family stated understanding of instructions given. Patient has an appointment with Dr. Mardelle Matte

## 2018-05-04 NOTE — Evaluation (Signed)
Occupational Therapy Evaluation Patient Details Name: Alan Kennedy MRN: [id number] DOB: [birth year] Today's Date: 05/04/2018    History of Present Illness Pt is a 57 y/o male now s/p L TSA.    Clinical Impression   This 57 y/o male presents with the above. At baseline pt is independent with functional mobility, reports he was intermittently receiving assist from spouse for UB dressing due to recent UE difficulties. Pt currently requiring minA for LB ADLs, modA for UB ADLs secondary to LUE limitations. Educated both pt/pt's spouse regarding shoulder precautions, sling management, safety and compensatory strategies for completing ADLs while maintaining precautions. Pt and pt's spouse both verbalizing and return demonstrating good understanding throughout. Spouse present and engaged during session, reports she will be available at time of discharge to provide assist with ADLs PRN. Questions answered throughout. Feel pt is safe to return home from OT standpoint once medically ready given available family assist and follow up as recommended per MD. No further acute OT needs identified at this time. Will sign off.     Follow Up Recommendations  Follow surgeon's recommendation for DC plan and follow-up therapies;Supervision/Assistance - 24 hour(24hr initially )    Equipment Recommendations  None recommended by OT           Precautions / Restrictions Precautions Precautions: Shoulder Type of Shoulder Precautions: sling on at all times except during ADL/exercise; okay for AROM e/w/h to tolerance; NO A/PROM to shoulder  Shoulder Interventions: Shoulder sling/immobilizer;At all times;Off for dressing/bathing/exercises Precaution Booklet Issued: Yes (comment) Precaution Comments: issued and reviewed with pt/pt's spouse  Restrictions Weight Bearing Restrictions: Yes LUE Weight Bearing: Non weight bearing      Mobility Bed Mobility Overal bed mobility: Modified Independent              General bed mobility comments: HOB slightly elevated, no physical assist required; educated on maintaining NWB in LUE during bed mobility   Transfers Overall transfer level: Modified independent Equipment used: None                  Balance Overall balance assessment: No apparent balance deficits (not formally assessed)                                         ADL either performed or assessed with clinical judgement   ADL Overall ADL's : Needs assistance/impaired Eating/Feeding: Modified independent;Sitting   Grooming: Modified independent;Standing   Upper Body Bathing: Minimal assistance;Sitting Upper Body Bathing Details (indicate cue type and reason): educated on strategies for bathing task while maintaining precautions  Lower Body Bathing: Sit to/from stand;Minimal assistance   Upper Body Dressing : Moderate assistance;Sitting;With caregiver independent assisting Upper Body Dressing Details (indicate cue type and reason): pt donning overhead shirt and sling, min cues for maintaining shoulder precautions with good carryover, spouse assisting during task completion  Lower Body Dressing: Minimal assistance;Sit to/from stand   Toilet Transfer: Min guard;Ambulation;Regular Museum/gallery exhibitions officer and Hygiene: Sit to/from stand;Minimal assistance Toileting - Clothing Manipulation Details (indicate cue type and reason): pt reports he typically uses LUE for peri-care, reports will most likely have difficulties with RUE - educated on use of toilet aide to assist with task if needed, demonstrated to pt/pt's spouse what AE looks like and where to locate for purchase      Functional mobility during ADLs: Min guard;Supervision/safety General ADL Comments: educated both pt/pt's  spouse regarding shoulder precautions, safety and compensatory strategies for completing ADLs while maintaining precautions. Pt and pt's spouse both verbalizing and return  demonstrating good understanding throughout                          Pertinent Vitals/Pain Pain Assessment: Faces Faces Pain Scale: Hurts little more Pain Location: L shoulder  Pain Descriptors / Indicators: Guarding;Sore Pain Intervention(s): Monitored during session;Repositioned     Hand Dominance Left   Extremity/Trunk Assessment Upper Extremity Assessment Upper Extremity Assessment: RUE deficits/detail;LUE deficits/detail RUE Deficits / Details: reports baseline difficulties with RUE, appears WFL for functional tasks completed this session  LUE Deficits / Details: s/p L TSA  LUE: Unable to fully assess due to immobilization   Lower Extremity Assessment Lower Extremity Assessment: Overall WFL for tasks assessed   Cervical / Trunk Assessment Cervical / Trunk Assessment: Normal   Communication Communication Communication: No difficulties   Cognition Arousal/Alertness: Awake/alert Behavior During Therapy: WFL for tasks assessed/performed Overall Cognitive Status: Within Functional Limits for tasks assessed                                     General Comments       Exercises Shoulder Exercises Elbow Flexion: AROM;10 reps;Left;Standing Elbow Extension: AROM;10 reps;Left;Standing Wrist Flexion: AROM;10 reps;Left;Seated Wrist Extension: AROM;10 reps;Left;Seated Digit Composite Flexion: AROM;10 reps;Left;Seated Composite Extension: AROM;10 reps;Left;Seated Neck Flexion: AROM;Seated Neck Extension: AROM;Seated Neck Lateral Flexion - Right: AROM;Seated Neck Lateral Flexion - Left: AROM;Seated Hand Exercises Forearm Supination: AROM;10 reps;Left;Seated Forearm Pronation: AROM;10 reps;Left;Seated   Shoulder Instructions Shoulder Instructions Donning/doffing shirt without moving shoulder: Moderate assistance;Caregiver independent with task Method for sponge bathing under operated UE: Minimal assistance;Caregiver independent with  task Donning/doffing sling/immobilizer: Moderate assistance;Caregiver independent with task Correct positioning of sling/immobilizer: Minimal assistance;Caregiver independent with task ROM for elbow, wrist and digits of operated UE: Independent Sling wearing schedule (on at all times/off for ADL's): Independent Proper positioning of operated UE when showering: Independent Positioning of UE while sleeping: Minimal assistance;Caregiver independent with task    Home Living Family/patient expects to be discharged to:: Private residence Living Arrangements: Spouse/significant other Available Help at Discharge: Family;Available 24 hours/day(24hrs available first couple of weeks ) Type of Home: House             Bathroom Shower/Tub: Teacher, early years/pre: Standard     Home Equipment: Shower seat          Prior Functioning/Environment Level of Independence: Independent;Needs assistance  Gait / Transfers Assistance Needed: independent with ambulation ADL's / Homemaking Assistance Needed: receiving intermittent assist from spouse for dressing ADLs due to recent UE difficulties             OT Problem List: Pain;Impaired UE functional use;Decreased knowledge of precautions;Decreased range of motion      OT Treatment/Interventions:      OT Goals(Current goals can be found in the care plan section) Acute Rehab OT Goals Patient Stated Goal: return home today  OT Goal Formulation: All assessment and education complete, DC therapy  OT Frequency:     Barriers to D/C:            Co-evaluation              AM-PAC PT "6 Clicks" Daily Activity     Outcome Measure Help from another person eating meals?: None Help from another person  taking care of personal grooming?: None Help from another person toileting, which includes using toliet, bedpan, or urinal?: A Little Help from another person bathing (including washing, rinsing, drying)?: A Little Help from another  person to put on and taking off regular upper body clothing?: A Lot Help from another person to put on and taking off regular lower body clothing?: A Little 6 Click Score: 19   End of Session Equipment Utilized During Treatment: Other (comment)(sling ) Nurse Communication: Mobility status  Activity Tolerance: Patient tolerated treatment well Patient left: with family/visitor present;Other (comment)(MD present )  OT Visit Diagnosis: Muscle weakness (generalized) (M62.81);Other (comment)(L TSA )                Time: 9396-8864 OT Time Calculation (min): 27 min Charges:  OT General Charges $OT Visit: 1 Visit OT Evaluation $OT Eval Low Complexity: 1 Low OT Treatments $Self Care/Home Management : 8-22 mins  Lou Cal, OT Pager [phone] 05/04/2018   Raymondo Band 05/04/2018, 10:16 AM

## 2018-05-04 NOTE — Discharge Summary (Signed)
Physician Discharge Summary  Patient ID: Alan Kennedy MRN: [id number] DOB/AGE: 06-10-1961 57 y.o.  Admit date: 05/03/2018 Discharge date: 05/04/2018  Admission Diagnoses:  Primary localized osteoarthrosis of left shoulder  Discharge Diagnoses:  Principal Problem:   Primary localized osteoarthrosis of left shoulder Active Problems:   Primary localized osteoarthrosis of shoulder   Past Medical History:  Diagnosis Date  . Arthritis    knees  . Depression   . GERD (gastroesophageal reflux disease)    takes OTC  . Gout   . History of kidney stones   . Osteoarthritis of left knee, medial 11/14/2013  . Primary localized osteoarthrosis of left shoulder 05/03/2018    Surgeries: Procedure(s): LEFT TOTAL SHOULDER ARTHROPLASTY on 05/03/2018   Consultants (if any):   Discharged Condition: Improved  Hospital Course: Alan Kennedy is an 57 y.o. male who was admitted 05/03/2018 with a diagnosis of Primary localized osteoarthrosis of left shoulder and went to the operating room on 05/03/2018 and underwent the above named procedures.    He was given perioperative antibiotics:  Anti-infectives (From admission, onward)   Start     Dose/Rate Route Frequency Ordered Stop   05/03/18 1400  ceFAZolin (ANCEF) IVPB 1 g/50 mL premix     1 g 100 mL/hr over 30 Minutes Intravenous Every 6 hours 05/03/18 1145 05/04/18 0302   05/03/18 0630  ceFAZolin (ANCEF) IVPB 2g/100 mL premix     2 g 200 mL/hr over 30 Minutes Intravenous To [facility] 05/02/18 0927 05/03/18 0752    .  He was given sequential compression devices, early ambulation for DVT prophylaxis.  He was neurovascularly intact on the morning of postoperative day 1, he felt like his blockade completely worn off.  He benefited maximally from the hospital stay and there were no complications.    Recent vital signs:  Vitals:   05/04/18 0335 05/04/18 0719  BP: 102/69 113/67  Pulse: 65 63  Resp: 18 18  Temp: 98.2 F (36.8 C) 98.4 F  (36.9 C)  SpO2: 96% 95%    Recent laboratory studies:  Lab Results  Component Value Date   HGB 12.5 (L) 05/04/2018   HGB 14.7 04/22/2018   HGB 14.9 05/20/2015   Lab Results  Component Value Date   WBC 13.0 (H) 05/04/2018   PLT 269 05/04/2018   Lab Results  Component Value Date   INR 1.0 05/29/2008   Lab Results  Component Value Date   NA 137 05/04/2018   K 4.3 05/04/2018   CL 100 05/04/2018   CO2 28 05/04/2018   BUN 12 05/04/2018   CREATININE 1.08 05/04/2018   GLUCOSE 130 (H) 05/04/2018    Discharge Medications:   Allergies as of 05/04/2018   No Known Allergies     Medication List    STOP taking these medications   acetaminophen 500 MG tablet Commonly known as:  TYLENOL   ibuprofen 200 MG tablet Commonly known as:  ADVIL,MOTRIN   ibuprofen 600 MG tablet Commonly known as:  ADVIL,MOTRIN     TAKE these medications   atorvastatin 20 MG tablet Commonly known as:  LIPITOR Take 20 mg by mouth daily.   baclofen 10 MG tablet Commonly known as:  LIORESAL Take 1 tablet (10 mg total) by mouth 3 (three) times daily. As needed for muscle spasm   clomiPHENE 50 MG tablet Commonly known as:  CLOMID TAKE 1/2 TABLET 3 TIMES WEEKLY What changed:  See the new instructions.   diphenhydrAMINE 25 MG tablet Commonly known  as:  BENADRYL Take 12.5-25 mg by mouth daily as needed for allergies.   JUICE PLUS FIBRE PO Take 2 tablets by mouth daily. Juice plus veggie   JUICE PLUS FIBRE PO Take 2 tablets by mouth daily. Juice plus fruit   multivitamin with minerals tablet Take 1 tablet by mouth daily.   ondansetron 4 MG tablet Commonly known as:  ZOFRAN Take 1 tablet (4 mg total) by mouth every 8 (eight) hours as needed for nausea or vomiting.   OVER THE COUNTER MEDICATION Place 1 Dose under the tongue 2 (two) times daily. CBD oil   oxyCODONE-acetaminophen 10-325 MG tablet Commonly known as:  PERCOCET Take 1-2 tablets by mouth every 8 (eight) hours as needed for  pain. MAXIMUM TOTAL ACETAMINOPHEN DOSE IS 4000 MG PER DAY   ranitidine 150 MG tablet Commonly known as:  ZANTAC Take 150 mg by mouth daily as needed for heartburn.   sennosides-docusate sodium 8.6-50 MG tablet Commonly known as:  SENOKOT-S Take 2 tablets by mouth daily.   Testosterone 20.25 MG/ACT (1.62%) Gel Apply 1 Pump topically daily.   traZODone 100 MG tablet Commonly known as:  DESYREL Take 100 mg by mouth at bedtime as needed for sleep.   VASCEPA 1 g Caps Generic drug:  Icosapent Ethyl Take 2 g by mouth daily.       Diagnostic Studies: Dg Shoulder Left Port  Result Date: 05/03/2018 CLINICAL DATA:  Status post left shoulder replacement. EXAM: LEFT SHOULDER - 1 VIEW COMPARISON:  No recent studies in [facility] FINDINGS: The patient has undergone prosthetic left shoulder joint replacement. Radiographic positioning of the prosthetic components is good. The interface with the native bone appears normal. There is no acute native bone fracture. There is patchy density at the left lung base with obscuration of the left hemidiaphragm. IMPRESSION: No immediate postprocedure complication following left shoulder joint replacement. Increased density at the left lung base. A chest x-ray is recommended. Electronically Signed   By: David  Martinique M.D.   On: 05/03/2018 11:37    Disposition:     Follow-up Information    Marchia Bond, MD. Schedule an appointment as soon as possible for a visit in 2 weeks.   Specialty:  Orthopedic Surgery Contact information: [address] [place] [address] [phone]            Signed: Johnny Bridge 05/04/2018, 8:45 AM

## 2018-05-18 DIAGNOSIS — M19012 Primary osteoarthritis, left shoulder: Secondary | ICD-10-CM | POA: Diagnosis not present

## 2018-05-18 DIAGNOSIS — M19011 Primary osteoarthritis, right shoulder: Secondary | ICD-10-CM | POA: Diagnosis not present

## 2018-05-23 DIAGNOSIS — M109 Gout, unspecified: Secondary | ICD-10-CM | POA: Diagnosis not present

## 2018-06-15 DIAGNOSIS — M19012 Primary osteoarthritis, left shoulder: Secondary | ICD-10-CM | POA: Diagnosis not present

## 2018-06-15 DIAGNOSIS — M19011 Primary osteoarthritis, right shoulder: Secondary | ICD-10-CM | POA: Diagnosis not present

## 2018-06-16 ENCOUNTER — Other Ambulatory Visit: Payer: Self-pay | Admitting: Orthopedic Surgery

## 2018-06-16 DIAGNOSIS — M19011 Primary osteoarthritis, right shoulder: Secondary | ICD-10-CM

## 2018-06-16 DIAGNOSIS — M19012 Primary osteoarthritis, left shoulder: Secondary | ICD-10-CM

## 2018-06-17 ENCOUNTER — Other Ambulatory Visit: Payer: Self-pay | Admitting: Orthopedic Surgery

## 2018-06-17 DIAGNOSIS — M19011 Primary osteoarthritis, right shoulder: Secondary | ICD-10-CM

## 2018-06-20 DIAGNOSIS — M25612 Stiffness of left shoulder, not elsewhere classified: Secondary | ICD-10-CM | POA: Diagnosis not present

## 2018-06-20 DIAGNOSIS — M19012 Primary osteoarthritis, left shoulder: Secondary | ICD-10-CM | POA: Diagnosis not present

## 2018-06-20 DIAGNOSIS — Z96612 Presence of left artificial shoulder joint: Secondary | ICD-10-CM | POA: Diagnosis not present

## 2018-06-23 DIAGNOSIS — M25612 Stiffness of left shoulder, not elsewhere classified: Secondary | ICD-10-CM | POA: Diagnosis not present

## 2018-06-23 DIAGNOSIS — M19012 Primary osteoarthritis, left shoulder: Secondary | ICD-10-CM | POA: Diagnosis not present

## 2018-06-23 DIAGNOSIS — M6281 Muscle weakness (generalized): Secondary | ICD-10-CM | POA: Diagnosis not present

## 2018-06-27 DIAGNOSIS — M25612 Stiffness of left shoulder, not elsewhere classified: Secondary | ICD-10-CM | POA: Diagnosis not present

## 2018-06-27 DIAGNOSIS — M6281 Muscle weakness (generalized): Secondary | ICD-10-CM | POA: Diagnosis not present

## 2018-06-27 DIAGNOSIS — M19012 Primary osteoarthritis, left shoulder: Secondary | ICD-10-CM | POA: Diagnosis not present

## 2018-06-30 DIAGNOSIS — Z96612 Presence of left artificial shoulder joint: Secondary | ICD-10-CM | POA: Diagnosis not present

## 2018-06-30 DIAGNOSIS — M19012 Primary osteoarthritis, left shoulder: Secondary | ICD-10-CM | POA: Diagnosis not present

## 2018-06-30 DIAGNOSIS — M25612 Stiffness of left shoulder, not elsewhere classified: Secondary | ICD-10-CM | POA: Diagnosis not present

## 2018-07-01 ENCOUNTER — Ambulatory Visit
Admission: RE | Admit: 2018-07-01 | Discharge: 2018-07-01 | Disposition: A | Discharge status: Home / Self Care | Payer: 59 | Source: Ambulatory Visit | Attending: Orthopedic Surgery | Admitting: Orthopedic Surgery

## 2018-07-01 DIAGNOSIS — M25511 Pain in right shoulder: Secondary | ICD-10-CM | POA: Diagnosis not present

## 2018-07-01 DIAGNOSIS — M19012 Primary osteoarthritis, left shoulder: Secondary | ICD-10-CM

## 2018-07-01 DIAGNOSIS — S46011A Strain of muscle(s) and tendon(s) of the rotator cuff of right shoulder, initial encounter: Secondary | ICD-10-CM | POA: Diagnosis not present

## 2018-07-01 DIAGNOSIS — M19011 Primary osteoarthritis, right shoulder: Secondary | ICD-10-CM

## 2018-07-01 MED ORDER — IOPAMIDOL (ISOVUE-M 200) INJECTION 41%
15.0000 mL | Freq: Once | INTRAMUSCULAR | Status: AC
  Administered 2018-07-01: 15 mL via INTRA_ARTICULAR

## 2018-07-01 MED ORDER — METHYLPREDNISOLONE ACETATE 40 MG/ML INJ SUSP (RADIOLOG
120.0000 mg | Freq: Once | INTRAMUSCULAR | Status: AC
  Administered 2018-07-01: 120 mg via INTRA_ARTICULAR

## 2018-07-04 DIAGNOSIS — M6281 Muscle weakness (generalized): Secondary | ICD-10-CM | POA: Diagnosis not present

## 2018-07-04 DIAGNOSIS — M19012 Primary osteoarthritis, left shoulder: Secondary | ICD-10-CM | POA: Diagnosis not present

## 2018-07-04 DIAGNOSIS — M25612 Stiffness of left shoulder, not elsewhere classified: Secondary | ICD-10-CM | POA: Diagnosis not present

## 2018-07-04 DIAGNOSIS — M25511 Pain in right shoulder: Secondary | ICD-10-CM | POA: Diagnosis not present

## 2018-07-06 ENCOUNTER — Other Ambulatory Visit: Payer: Self-pay

## 2018-07-08 ENCOUNTER — Other Ambulatory Visit: Payer: Self-pay

## 2018-07-11 DIAGNOSIS — Z96612 Presence of left artificial shoulder joint: Secondary | ICD-10-CM | POA: Diagnosis not present

## 2018-07-11 DIAGNOSIS — M25612 Stiffness of left shoulder, not elsewhere classified: Secondary | ICD-10-CM | POA: Diagnosis not present

## 2018-07-11 DIAGNOSIS — M19012 Primary osteoarthritis, left shoulder: Secondary | ICD-10-CM | POA: Diagnosis not present

## 2018-07-14 DIAGNOSIS — Z96612 Presence of left artificial shoulder joint: Secondary | ICD-10-CM | POA: Diagnosis not present

## 2018-07-14 DIAGNOSIS — M19012 Primary osteoarthritis, left shoulder: Secondary | ICD-10-CM | POA: Diagnosis not present

## 2018-07-14 DIAGNOSIS — M6281 Muscle weakness (generalized): Secondary | ICD-10-CM | POA: Diagnosis not present

## 2018-07-18 DIAGNOSIS — M19012 Primary osteoarthritis, left shoulder: Secondary | ICD-10-CM | POA: Diagnosis not present

## 2018-07-18 DIAGNOSIS — Z96612 Presence of left artificial shoulder joint: Secondary | ICD-10-CM | POA: Diagnosis not present

## 2018-07-18 DIAGNOSIS — M25612 Stiffness of left shoulder, not elsewhere classified: Secondary | ICD-10-CM | POA: Diagnosis not present

## 2018-07-21 DIAGNOSIS — Z96612 Presence of left artificial shoulder joint: Secondary | ICD-10-CM | POA: Diagnosis not present

## 2018-07-21 DIAGNOSIS — M25612 Stiffness of left shoulder, not elsewhere classified: Secondary | ICD-10-CM | POA: Diagnosis not present

## 2018-07-21 DIAGNOSIS — M19012 Primary osteoarthritis, left shoulder: Secondary | ICD-10-CM | POA: Diagnosis not present

## 2018-07-25 DIAGNOSIS — Z96612 Presence of left artificial shoulder joint: Secondary | ICD-10-CM | POA: Diagnosis not present

## 2018-07-25 DIAGNOSIS — M19012 Primary osteoarthritis, left shoulder: Secondary | ICD-10-CM | POA: Diagnosis not present

## 2018-07-25 DIAGNOSIS — M25612 Stiffness of left shoulder, not elsewhere classified: Secondary | ICD-10-CM | POA: Diagnosis not present

## 2018-07-27 DIAGNOSIS — M19012 Primary osteoarthritis, left shoulder: Secondary | ICD-10-CM | POA: Diagnosis not present

## 2018-07-27 DIAGNOSIS — M6281 Muscle weakness (generalized): Secondary | ICD-10-CM | POA: Diagnosis not present

## 2018-07-27 DIAGNOSIS — M25612 Stiffness of left shoulder, not elsewhere classified: Secondary | ICD-10-CM | POA: Diagnosis not present

## 2018-07-28 DIAGNOSIS — M24111 Other articular cartilage disorders, right shoulder: Secondary | ICD-10-CM | POA: Diagnosis not present

## 2018-07-28 DIAGNOSIS — M75111 Incomplete rotator cuff tear or rupture of right shoulder, not specified as traumatic: Secondary | ICD-10-CM | POA: Diagnosis not present

## 2018-07-28 DIAGNOSIS — G8918 Other acute postprocedural pain: Secondary | ICD-10-CM | POA: Diagnosis not present

## 2018-07-28 DIAGNOSIS — M7501 Adhesive capsulitis of right shoulder: Secondary | ICD-10-CM | POA: Diagnosis not present

## 2018-08-04 DIAGNOSIS — M19012 Primary osteoarthritis, left shoulder: Secondary | ICD-10-CM | POA: Diagnosis not present

## 2018-08-04 DIAGNOSIS — Z96612 Presence of left artificial shoulder joint: Secondary | ICD-10-CM | POA: Diagnosis not present

## 2018-08-04 DIAGNOSIS — M25612 Stiffness of left shoulder, not elsewhere classified: Secondary | ICD-10-CM | POA: Diagnosis not present

## 2018-08-11 DIAGNOSIS — M25612 Stiffness of left shoulder, not elsewhere classified: Secondary | ICD-10-CM | POA: Diagnosis not present

## 2018-08-11 DIAGNOSIS — Z96612 Presence of left artificial shoulder joint: Secondary | ICD-10-CM | POA: Diagnosis not present

## 2018-08-11 DIAGNOSIS — M19012 Primary osteoarthritis, left shoulder: Secondary | ICD-10-CM | POA: Diagnosis not present

## 2018-08-15 DIAGNOSIS — Z96612 Presence of left artificial shoulder joint: Secondary | ICD-10-CM | POA: Diagnosis not present

## 2018-08-15 DIAGNOSIS — M25612 Stiffness of left shoulder, not elsewhere classified: Secondary | ICD-10-CM | POA: Diagnosis not present

## 2018-08-15 DIAGNOSIS — M19012 Primary osteoarthritis, left shoulder: Secondary | ICD-10-CM | POA: Diagnosis not present

## 2018-08-18 DIAGNOSIS — M19012 Primary osteoarthritis, left shoulder: Secondary | ICD-10-CM | POA: Diagnosis not present

## 2018-08-18 DIAGNOSIS — M25612 Stiffness of left shoulder, not elsewhere classified: Secondary | ICD-10-CM | POA: Diagnosis not present

## 2018-08-18 DIAGNOSIS — Z96612 Presence of left artificial shoulder joint: Secondary | ICD-10-CM | POA: Diagnosis not present

## 2018-08-22 DIAGNOSIS — M19012 Primary osteoarthritis, left shoulder: Secondary | ICD-10-CM | POA: Diagnosis not present

## 2018-08-22 DIAGNOSIS — Z96612 Presence of left artificial shoulder joint: Secondary | ICD-10-CM | POA: Diagnosis not present

## 2018-08-22 DIAGNOSIS — M25612 Stiffness of left shoulder, not elsewhere classified: Secondary | ICD-10-CM | POA: Diagnosis not present

## 2018-08-24 DIAGNOSIS — M19012 Primary osteoarthritis, left shoulder: Secondary | ICD-10-CM | POA: Diagnosis not present

## 2018-08-24 DIAGNOSIS — M6281 Muscle weakness (generalized): Secondary | ICD-10-CM | POA: Diagnosis not present

## 2018-08-24 DIAGNOSIS — M25612 Stiffness of left shoulder, not elsewhere classified: Secondary | ICD-10-CM | POA: Diagnosis not present

## 2018-08-30 DIAGNOSIS — M6281 Muscle weakness (generalized): Secondary | ICD-10-CM | POA: Diagnosis not present

## 2018-08-30 DIAGNOSIS — M19012 Primary osteoarthritis, left shoulder: Secondary | ICD-10-CM | POA: Diagnosis not present

## 2018-08-30 DIAGNOSIS — M25612 Stiffness of left shoulder, not elsewhere classified: Secondary | ICD-10-CM | POA: Diagnosis not present

## 2018-09-01 DIAGNOSIS — M19012 Primary osteoarthritis, left shoulder: Secondary | ICD-10-CM | POA: Diagnosis not present

## 2018-09-01 DIAGNOSIS — M6281 Muscle weakness (generalized): Secondary | ICD-10-CM | POA: Diagnosis not present

## 2018-09-01 DIAGNOSIS — M25612 Stiffness of left shoulder, not elsewhere classified: Secondary | ICD-10-CM | POA: Diagnosis not present

## 2018-09-06 DIAGNOSIS — M19012 Primary osteoarthritis, left shoulder: Secondary | ICD-10-CM | POA: Diagnosis not present

## 2018-09-06 DIAGNOSIS — M25612 Stiffness of left shoulder, not elsewhere classified: Secondary | ICD-10-CM | POA: Diagnosis not present

## 2018-09-06 DIAGNOSIS — M6281 Muscle weakness (generalized): Secondary | ICD-10-CM | POA: Diagnosis not present

## 2019-02-22 IMAGING — DX DG TOE GREAT 2+V*L*
3 series · 3 of 3 positions shown · non-contrast
Comparison: Left foot series dated January 10, 2004

CLINICAL DATA: Left great toe injury yesterday

EXAM:
LEFT GREAT TOE

[toe ap]
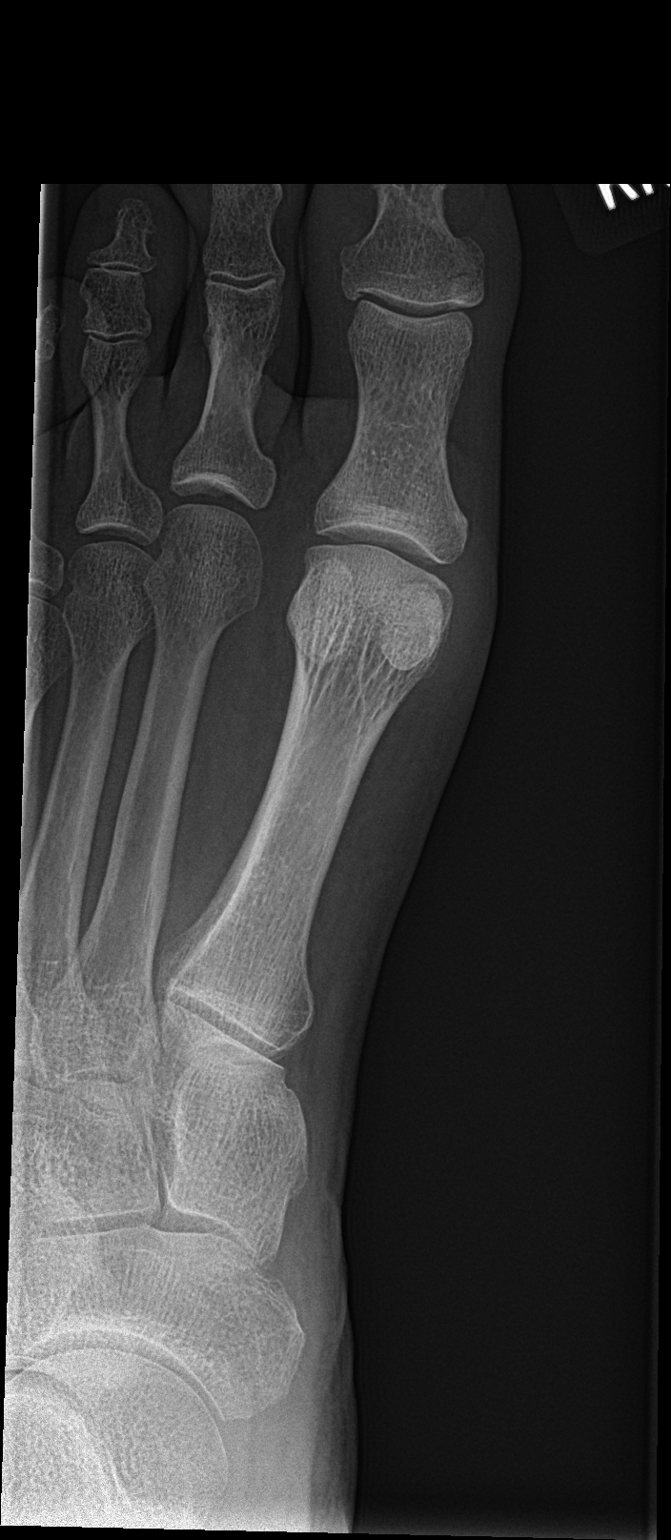

[toe obl]
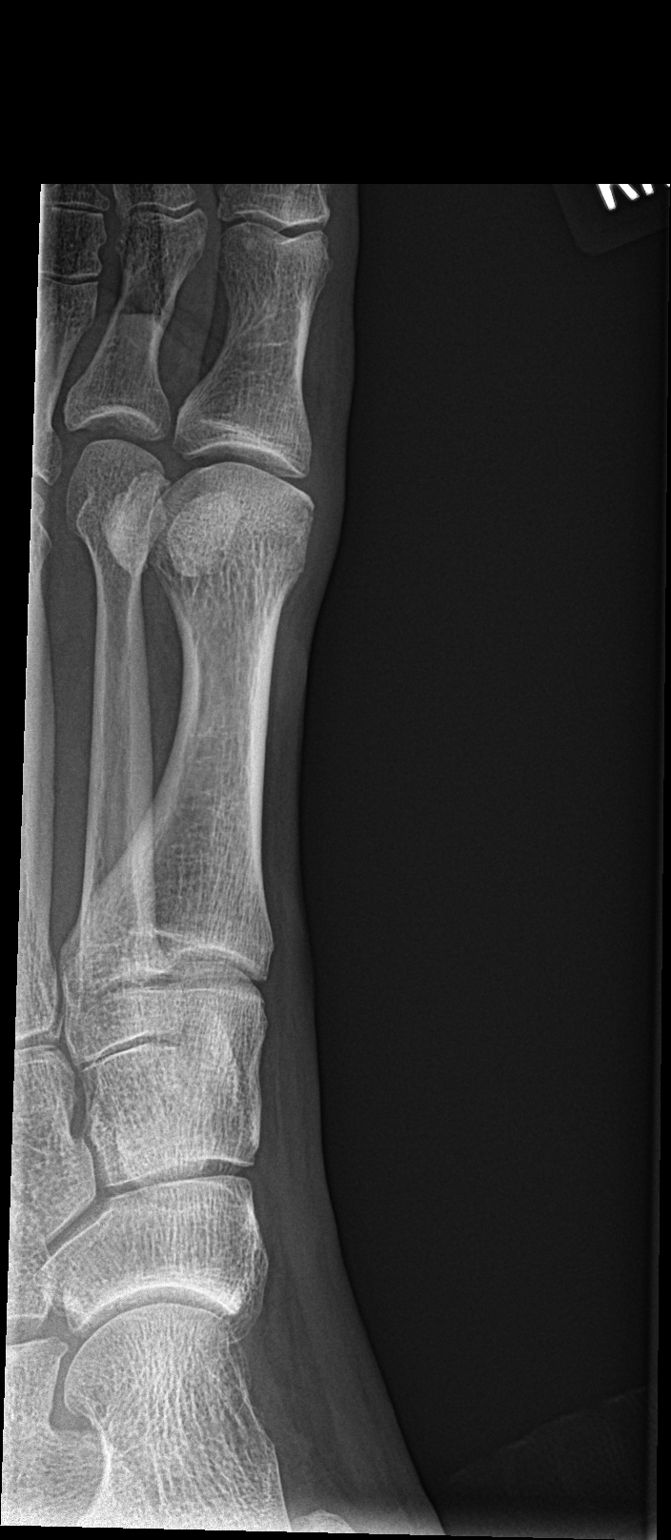

[toe lat]
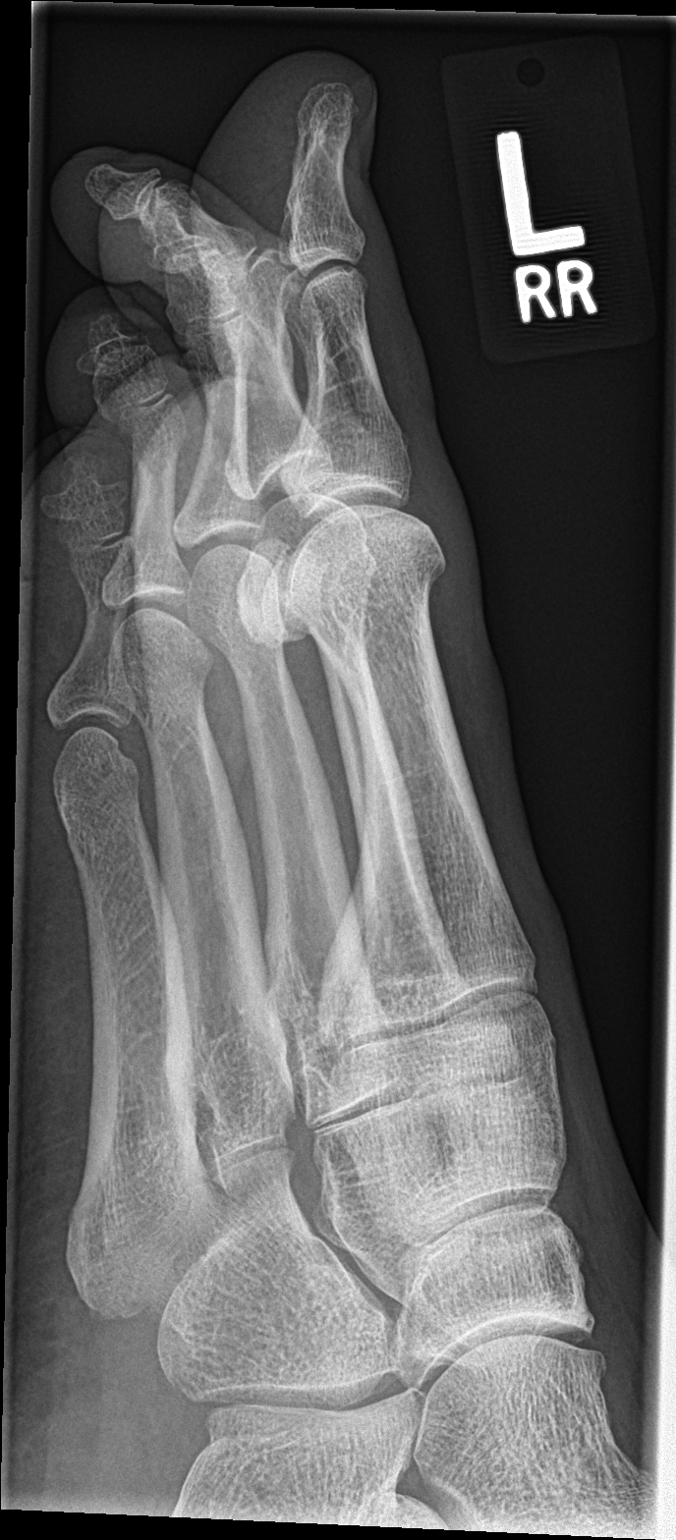

[3 of 3 positions shown; findings below may reference images not displayed]

FINDINGS: The patient has sustained a nondisplaced fracture through the medial
aspect of the base of the distal phalanx. The fracture line extends
to the articular surface. The proximal phalanx is intact. The IP and
MTP joint spaces are well maintained. The first metatarsal is
normal.
IMPRESSION: Nondisplaced fracture involving the medial aspect of the base of the
distal phalanx of the left great toe.

## 2019-11-18 ENCOUNTER — Ambulatory Visit: Payer: 59 | Attending: Internal Medicine

## 2019-11-18 DIAGNOSIS — Z23 Encounter for immunization: Secondary | ICD-10-CM

## 2019-11-18 NOTE — Progress Notes (Signed)
   Covid-19 Vaccination Clinic  Name:  Alan Kennedy    MRN: [id number] DOB: 1961-07-06  11/18/2019  Alan Kennedy was observed post Covid-19 immunization for 15 minutes without incidence. He was provided with Vaccine Information Sheet and instruction to access the V-Safe system.   Alan Kennedy was instructed to call 911 with any severe reactions post vaccine: [name] Kitchen Difficulty breathing  . Swelling of your face and throat  . A fast heartbeat  . A bad rash all over your body  . Dizziness and weakness    Immunizations Administered    Name Date Dose VIS Date Route   Pfizer COVID-19 Vaccine 11/18/2019 12:55 PM 0.3 mL 09/08/2019 Intramuscular   Manufacturer: [place]   Lot: [id number]   [place]: [id number]

## 2019-12-12 ENCOUNTER — Ambulatory Visit: Payer: 59 | Attending: Internal Medicine

## 2019-12-12 DIAGNOSIS — Z23 Encounter for immunization: Secondary | ICD-10-CM

## 2019-12-12 NOTE — Progress Notes (Signed)
   Covid-19 Vaccination Clinic  Name:  Alan Kennedy    MRN: [id number] DOB: Apr 26, 1961  12/12/2019  Mr. Alan Kennedy was observed post Covid-19 immunization for 15 minutes without incident. He was provided with Vaccine Information Sheet and instruction to access the V-Safe system.   Mr. Alan Kennedy was instructed to call 911 with any severe reactions post vaccine: [name] Kitchen Difficulty breathing  . Swelling of face and throat  . A fast heartbeat  . A bad rash all over body  . Dizziness and weakness   Immunizations Administered    Name Date Dose VIS Date Route   Pfizer COVID-19 Vaccine 12/12/2019 12:10 PM 0.3 mL 09/08/2019 Intramuscular   Manufacturer: [place]   Lot: [id number]   [place]: [id number]

## 2020-03-05 ENCOUNTER — Emergency Department (HOSPITAL_BASED_OUTPATIENT_CLINIC_OR_DEPARTMENT_OTHER)
Admission: EM | Admit: 2020-03-05 | Discharge: 2020-03-05 | Disposition: A | Discharge status: Home / Self Care | Payer: 59 | Attending: Emergency Medicine | Admitting: Emergency Medicine

## 2020-03-05 ENCOUNTER — Other Ambulatory Visit: Payer: Self-pay

## 2020-03-05 ENCOUNTER — Encounter (HOSPITAL_BASED_OUTPATIENT_CLINIC_OR_DEPARTMENT_OTHER): Payer: Self-pay

## 2020-03-05 DIAGNOSIS — S61250A Open bite of right index finger without damage to nail, initial encounter: Secondary | ICD-10-CM | POA: Diagnosis not present

## 2020-03-05 DIAGNOSIS — Y9289 Other specified places as the place of occurrence of the external cause: Secondary | ICD-10-CM | POA: Diagnosis not present

## 2020-03-05 DIAGNOSIS — Y999 Unspecified external cause status: Secondary | ICD-10-CM | POA: Insufficient documentation

## 2020-03-05 DIAGNOSIS — W5911XA Bitten by nonvenomous snake, initial encounter: Secondary | ICD-10-CM | POA: Diagnosis not present

## 2020-03-05 DIAGNOSIS — Y9389 Activity, other specified: Secondary | ICD-10-CM | POA: Insufficient documentation

## 2020-03-05 MED ORDER — TETANUS-DIPHTH-ACELL PERTUSSIS 5-2.5-18.5 LF-MCG/0.5 IM SUSP: 0.5000 mL | Freq: Once | INTRAMUSCULAR | Status: DC

## 2020-03-05 MED ORDER — DOXYCYCLINE HYCLATE 100 MG PO CAPS: 100.0000 mg | ORAL_CAPSULE | Freq: Two times a day (BID) | ORAL | 0 refills | Status: AC

## 2020-03-05 NOTE — Discharge Instructions (Signed)
You were seen in the emergency room today with a snakebite.  This does not appear to be a venomous bite.  I am starting you on antibiotic to prevent infection.  Please take Tylenol and/or Motrin as needed for pain at home.  If the finger becomes severely painful, swollen, red or you develop discharge from the finger this could be a sign of more serious infection you should be evaluated quickly by either your primary care doctor or return to the emergency department for reevaluation.

## 2020-03-05 NOTE — ED Triage Notes (Signed)
Pt reports snake bite to right index finger ~740pm-NAD-steady gait

## 2020-03-05 NOTE — ED Provider Notes (Signed)
Emergency Department Provider Note   I have reviewed the triage vital signs and the nursing notes.   HISTORY  Chief Complaint Animal Bite   HPI Alan Kennedy is a 59 y.o. male presents to the ED with a snake bite to the right index finger. He is right hand dominant. He was reaching down to throw what he thought at a glance was a stick to toss it out of the way but turned out to be a small, black snake. The snake bit him on the finger. He grabbed the snake and killed it. He took a picture of the snake and putted a tooth from his finger. He reports looking in the mouth but not seeing fangs. His finger is mildly sore and slightly red but denies severe pain or rapid swelling/color change. No numbness. No additional bites. Tetanus updated in the last 5 years.    Past Medical History:  Diagnosis Date  . Arthritis    knees  . Depression   . GERD (gastroesophageal reflux disease)    takes OTC  . Gout   . History of kidney stones   . Osteoarthritis of left knee, medial 11/14/2013  . Primary localized osteoarthrosis of left shoulder 05/03/2018    Patient Active Problem List   Diagnosis Date Noted  . Primary localized osteoarthrosis of left shoulder 05/03/2018  . Primary localized osteoarthrosis of shoulder 05/03/2018  . Osteoarthritis of left knee, medial 11/14/2013    Past Surgical History:  Procedure Laterality Date  . BACK SURGERY     x3  . HAND RECONSTRUCTION Left   . JOINT REPLACEMENT    . KNEE ARTHROSCOPY     30% cartiledge removal, 2x's on the R, multiple times on the L   . KNEE SURGERY Left 11/14/2013   DR LANDAU            UNICOMPARTMENTAL   . NOSE SURGERY  2000   reconstruction   . PARTIAL KNEE ARTHROPLASTY Left 11/14/2013   Procedure: UNICOMPARTMENTAL LEFT KNEE;  Surgeon: Johnny Bridge, MD;  Location: [place];  Service: Orthopedics;  Laterality: Left;  . SHOULDER SURGERY Right    arthroscopy  . SPINAL CORD STIMULATOR IMPLANT  2009  . TOTAL SHOULDER  ARTHROPLASTY Left 05/03/2018   Procedure: LEFT TOTAL SHOULDER ARTHROPLASTY;  Surgeon: Marchia Bond, MD;  Location: [place];  Service: Orthopedics;  Laterality: Left;    Allergies Patient has no known allergies.  Family History  Family history unknown: Yes    Social History Social History   Tobacco Use  . Smoking status: Never Smoker  . Smokeless tobacco: Never Used  Substance Use Topics  . Alcohol use: No  . Drug use: No    Review of Systems  Constitutional: No fever/chills Musculoskeletal: Negative for back pain. Mild right finger pain.  Skin: Minimal right index finger erythema and minimal swelling. Neurological: Negative for numbness.  10-point ROS otherwise negative.  ____________________________________________   PHYSICAL EXAM:  VITAL SIGNS: ED Triage Vitals  Enc Vitals Group     BP 03/05/20 2024 (!) 165/99     Pulse Rate 03/05/20 2024 72     Resp 03/05/20 2024 19     Temp 03/05/20 2024 98.3 F (36.8 C)     Temp Source 03/05/20 2024 Oral     SpO2 03/05/20 2024 100 %     Weight 03/05/20 2024 215 lb (97.5 kg)     Height 03/05/20 2024 6\' 3"  (1.905 m)   Constitutional: Alert and oriented.  Well appearing and in no acute distress. Neck: No stridor.   Cardiovascular:  Good peripheral circulation. Normal capillary refill of the right index finger.  Respiratory: Normal respiratory effort.   Gastrointestinal: No distention.  Musculoskeletal: Minimal right index finger swelling and very mild redness. No ecchymosis or ulceration. Normal ROM of the right index finger.  Neurologic:  Normal speech and language. Normal finger sensation.  Skin:  Skin is warm and dry. Finger wound very shallow without FB.   ____________________________________________  RADIOLOGY  None ____________________________________________   PROCEDURES  Procedure(s) performed:   Procedures  None ____________________________________________   INITIAL IMPRESSION / ASSESSMENT AND PLAN /  ED COURSE  Pertinent labs & imaging results that were available during my care of the patient were reviewed by me and considered in my medical decision making (see chart for details).   Patient presents to the ED with snake bite. In review of the picture of the snake it appears to be non-venomous. The head is slender and pattern is dark. Finger consistent with this assessment with minimal swelling. No FB. Tetanus UTD. No indication for [employer]. Able to evaluate the wound fully. No plain film here. Plan for abx coverage at home. Wound cleaned PTA. Discussed strict ED return precautions if wound looking suddenly much worse.    ____________________________________________  FINAL CLINICAL IMPRESSION(S) / ED DIAGNOSES  Final diagnoses:  Snake bite, initial encounter     NEW OUTPATIENT MEDICATIONS STARTED DURING THIS VISIT:  Discharge Medication List as of 03/05/2020  8:50 PM    START taking these medications   Details  doxycycline (VIBRAMYCIN) 100 MG capsule Take 1 capsule (100 mg total) by mouth 2 (two) times daily for 7 days., Starting Tue 03/05/2020, Until Tue 03/12/2020, Normal        Note:  This document was prepared using Dragon voice recognition software and may include unintentional dictation errors.  Nanda Quinton, MD, [facility] Emergency Medicine    Harwood Nall, [facility], MD 03/06/20 1535

## 2020-06-06 IMAGING — XA DG FLUORO GUIDE NDL PLC/BX
2 series · 2 of 2 positions shown · non-contrast
Comparison: none

CLINICAL DATA: Right shoulder pain.

[Series 1: ortho standard · 1 of 1 slices shown (1 of 2)]
[im 1/1]
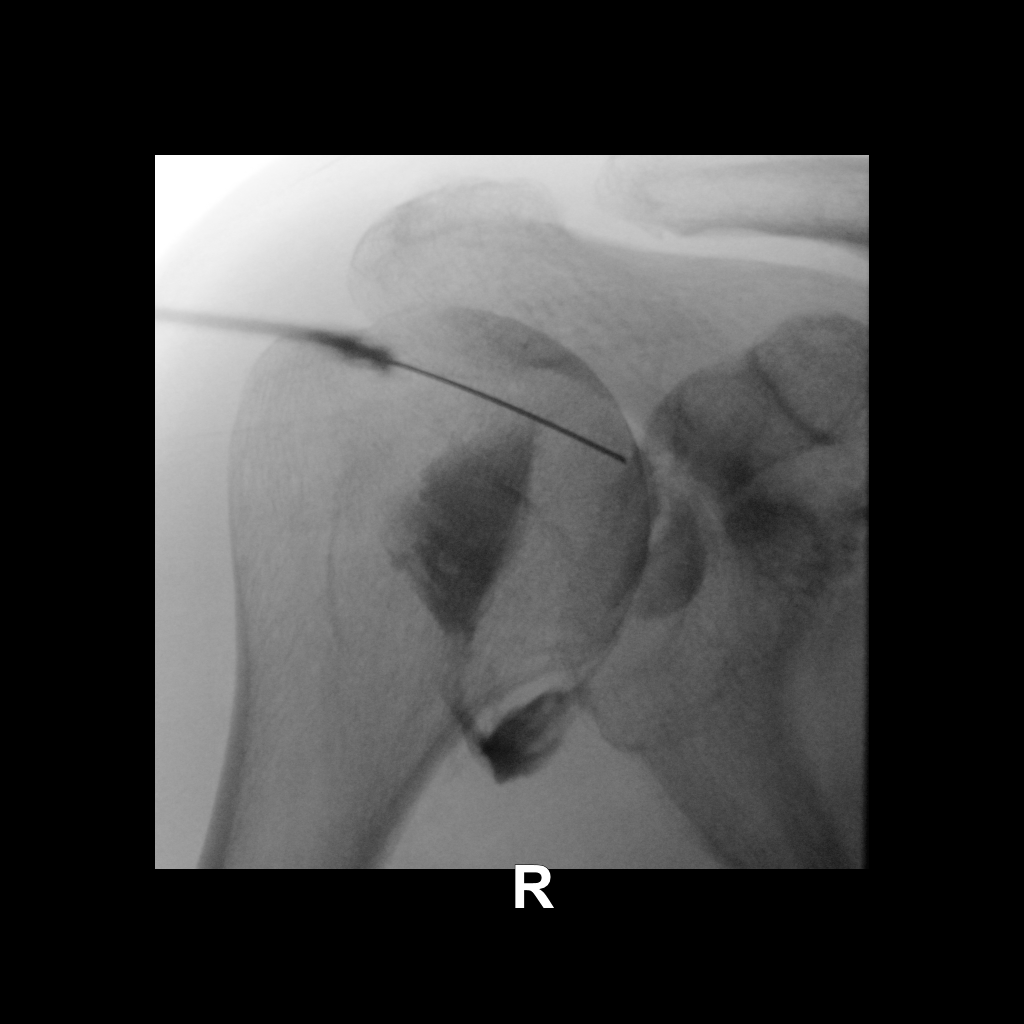

[Series 2: ortho standard · 1 of 1 slices shown (2 of 2)]
[im 1/1]
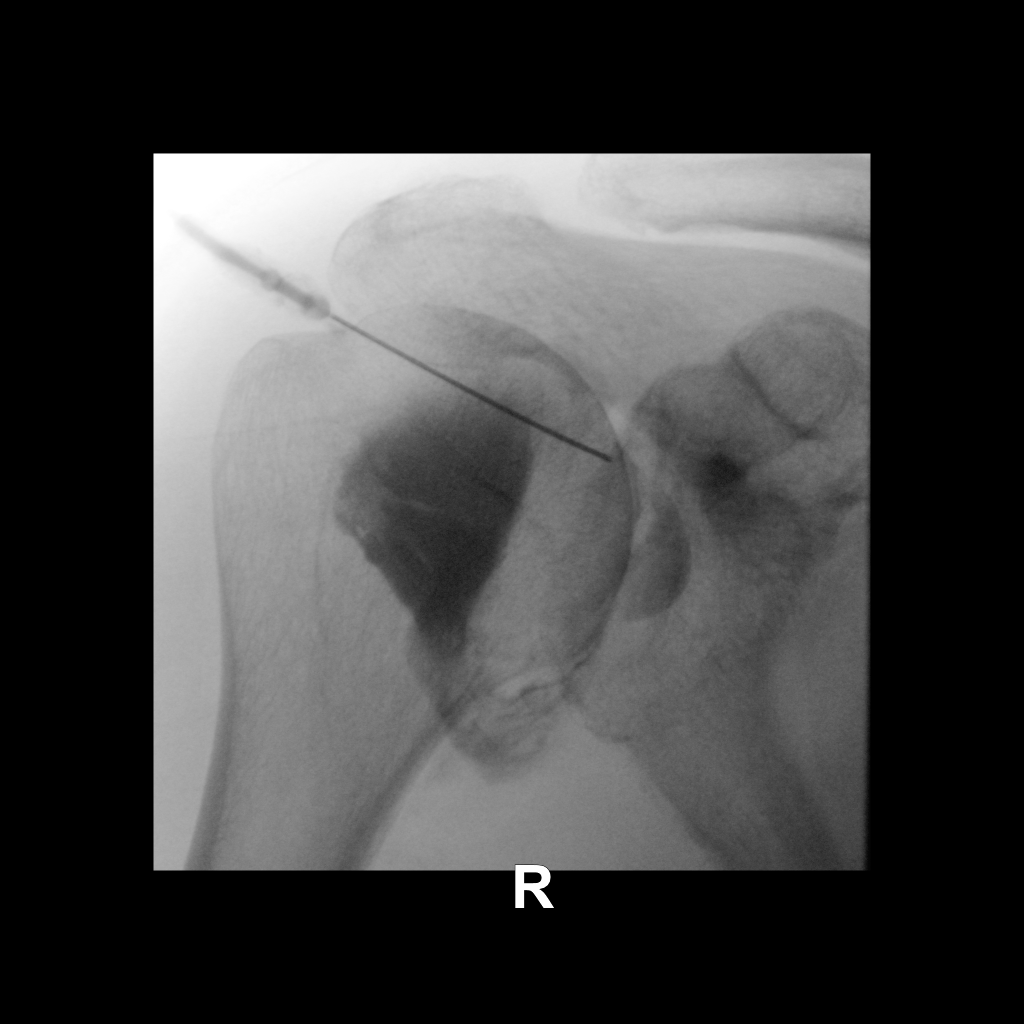

[2 of 2 positions shown; findings below may reference images not displayed]

FLUOROSCOPY TIME:  Radiation Exposure Index (as provided by the
fluoroscopic device): 9.11 uGy*m2

Fluoroscopy Time:  13 seconds

Number of Acquired Images:  0

PROCEDURE:
Right SHOULDER INJECTION UNDER FLUOROSCOPY

An appropriate skin entrance site was determined. The site was
marked, prepped with Betadine, draped in the usual sterile fashion,
and infiltrated locally with buffered Lidocaine. 22 gauge spinal
needle was advanced to the superomedial margin of the humeral head
under intermittent fluoroscopy. 1 ml of Lidocaine injected easily. A
mixture of 20 ml of dilute Isovue-M 200 was then used to opacify the
right shoulder capsule. I also injected 120 mg Depo-Medrol. No
immediate complication.
IMPRESSION: Technically successful right shoulder injection for CT. 120 mg
Depo-Medrol was also injected.
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
## 2022-05-07 ENCOUNTER — Emergency Department (HOSPITAL_BASED_OUTPATIENT_CLINIC_OR_DEPARTMENT_OTHER): Payer: 59

## 2022-05-07 ENCOUNTER — Emergency Department (HOSPITAL_BASED_OUTPATIENT_CLINIC_OR_DEPARTMENT_OTHER)
Admission: EM | Admit: 2022-05-07 | Discharge: 2022-05-07 | Disposition: A | Discharge status: Home / Self Care | Payer: 59 | Attending: Emergency Medicine | Admitting: Emergency Medicine

## 2022-05-07 ENCOUNTER — Other Ambulatory Visit: Payer: Self-pay

## 2022-05-07 ENCOUNTER — Encounter (HOSPITAL_BASED_OUTPATIENT_CLINIC_OR_DEPARTMENT_OTHER): Payer: Self-pay | Admitting: Urology

## 2022-05-07 DIAGNOSIS — S61432A Puncture wound without foreign body of left hand, initial encounter: Secondary | ICD-10-CM | POA: Insufficient documentation

## 2022-05-07 DIAGNOSIS — S60222A Contusion of left hand, initial encounter: Secondary | ICD-10-CM | POA: Diagnosis not present

## 2022-05-07 DIAGNOSIS — W540XXA Bitten by dog, initial encounter: Secondary | ICD-10-CM | POA: Insufficient documentation

## 2022-05-07 MED ORDER — AMOXICILLIN-POT CLAVULANATE 875-125 MG PO TABS: 1.0000 | ORAL_TABLET | Freq: Two times a day (BID) | ORAL | 0 refills | Status: DC

## 2022-05-07 MED ORDER — AMOXICILLIN-POT CLAVULANATE 875-125 MG PO TABS
1.0000 | ORAL_TABLET | Freq: Once | ORAL | Status: AC
  Administered 2022-05-07: 1 via ORAL
  Filled 2022-05-07: qty 1

## 2022-05-07 MED ORDER — HYDROCODONE-ACETAMINOPHEN 5-325 MG PO TABS
1.0000 | ORAL_TABLET | Freq: Once | ORAL | Status: AC
  Administered 2022-05-07: 1 via ORAL
  Filled 2022-05-07: qty 1

## 2022-05-07 MED ORDER — IBUPROFEN 800 MG PO TABS
800.0000 mg | ORAL_TABLET | Freq: Once | ORAL | Status: AC
  Administered 2022-05-07: 800 mg via ORAL
  Filled 2022-05-07: qty 1
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
## 2022-05-07 NOTE — Discharge Instructions (Signed)
Take antibiotics as prescribed.  Use ice, keep arm elevated.  Follow-up with your primary doctor or Dr. Phillip Heal in next week for recheck.  Return to the ED with worsening pain, numbness, tingling, bleeding, drainage or other concerns.
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
## 2022-05-07 NOTE — ED Notes (Signed)
Left hand limited movement , some swelling.Alert x 4

## 2023-05-20 ENCOUNTER — Other Ambulatory Visit: Payer: Self-pay | Admitting: Physician Assistant

## 2023-05-20 DIAGNOSIS — R1909 Other intra-abdominal and pelvic swelling, mass and lump: Secondary | ICD-10-CM

## 2023-05-25 ENCOUNTER — Ambulatory Visit
Admission: RE | Admit: 2023-05-25 | Discharge: 2023-05-25 | Disposition: A | Discharge status: Home / Self Care | Payer: 59 | Source: Ambulatory Visit | Attending: Physician Assistant | Admitting: Physician Assistant

## 2023-05-25 DIAGNOSIS — R1909 Other intra-abdominal and pelvic swelling, mass and lump: Secondary | ICD-10-CM

## 2023-07-26 ENCOUNTER — Other Ambulatory Visit: Payer: Self-pay

## 2023-07-26 ENCOUNTER — Ambulatory Visit
Admission: EM | Admit: 2023-07-26 | Discharge: 2023-07-26 | Disposition: A | Discharge status: Home / Self Care | Payer: 59 | Attending: Internal Medicine | Admitting: Internal Medicine

## 2023-07-26 DIAGNOSIS — H65191 Other acute nonsuppurative otitis media, right ear: Secondary | ICD-10-CM

## 2023-07-26 MED ORDER — CIPROFLOXACIN-DEXAMETHASONE 0.3-0.1 % OT SUSP: 4.0000 [drp] | Freq: Two times a day (BID) | OTIC | 0 refills | Status: AC

## 2023-07-26 MED ORDER — AMOXICILLIN-POT CLAVULANATE 875-125 MG PO TABS: 1.0000 | ORAL_TABLET | Freq: Two times a day (BID) | ORAL | 0 refills | Status: AC

## 2023-07-26 NOTE — ED Provider Notes (Signed)
[facility]    CSN: [id number] Arrival date & time: 07/26/23  1335      History   Chief Complaint No chief complaint on file.   HPI Alan Kennedy is a 62 y.o. male.   Patient presents with right ear pain that has been present for about 5 days.  Denies trauma or foreign body to the ear.  Denies any associated upper respiratory symptoms or fever.  Patient reports that he had a tube placed in that ear approximately 1 year ago after jumping into water.  He states he is not sure if the tube is still in place.  He has not taken any medication for his pain.     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   Primary localized osteoarthrosis of shoulder 05/03/2018   Osteoarthritis of left knee, medial 11/14/2013    Past Surgical History:  Procedure Laterality Date   BACK SURGERY     x3   EYE SURGERY     HAND RECONSTRUCTION Left    JOINT REPLACEMENT     KNEE ARTHROSCOPY     30% cartiledge removal, 2x's on the R, multiple times on the L    KNEE SURGERY Left 11/14/2013   DR LANDAU            UNICOMPARTMENTAL    NOSE SURGERY  2000   reconstruction    PARTIAL KNEE ARTHROPLASTY Left 11/14/2013   Procedure: UNICOMPARTMENTAL LEFT KNEE;  Surgeon: Eulas Post, MD;  Location: [facility] OR;  Service: Orthopedics;  Laterality: Left;   SHOULDER SURGERY Right    arthroscopy   SPINAL CORD STIMULATOR IMPLANT  2009   TOTAL SHOULDER ARTHROPLASTY Left 05/03/2018   Procedure: LEFT TOTAL SHOULDER ARTHROPLASTY;  Surgeon: Teryl Lucy, MD;  Location: [facility] OR;  Service: Orthopedics;  Laterality: Left;       Home Medications     Prior to Admission medications   Medication Sig Start Date End Date Taking? Authorizing Provider  amoxicillin-clavulanate (AUGMENTIN) 875-125 MG tablet Take 1 tablet by mouth every 12 (twelve) hours. 07/26/23  Yes [name], Rolly Salter E, FNP  ciprofloxacin-dexamethasone (CIPRODEX) OTIC suspension Place 4 drops into the right ear 2 (two) times daily for 7 days. 07/26/23 08/02/23 Yes Ambera Fedele, Acie Fredrickson, FNP  allopurinol (ZYLOPRIM) 300 MG tablet Take 300 mg by mouth daily. 03/02/20   [provider]  atorvastatin (LIPITOR) 20 MG tablet Take 20 mg by mouth daily.  04/27/17   [provider]  clomiPHENE (CLOMID) 50 MG tablet TAKE 1/2 TABLET 3 TIMES WEEKLY Patient taking differently: Take 25 mg by mouth once daily on Mon, Wed, and Fri 04/06/18   Reather Littler, MD  diphenhydrAMINE (BENADRYL) 25 MG tablet Take 12.5-25 mg by mouth daily as needed for allergies.    [provider]  Multiple Vitamins-Minerals (MULTIVITAMIN WITH MINERALS) tablet Take 1 tablet by mouth daily.    [provider]  Nutritional Supplements (JUICE PLUS FIBRE PO) Take 2 tablets by mouth daily. Juice plus veggie    [provider]  Nutritional Supplements (JUICE PLUS FIBRE PO) Take 2 tablets  by mouth daily. Juice plus fruit    [provider]  OVER THE COUNTER MEDICATION Place 1 Dose under the tongue 2 (two) times daily. CBD oil    [provider]  ranitidine (ZANTAC) 150 MG tablet Take 150 mg by mouth daily as needed for heartburn.    [provider]  sennosides-docusate sodium (SENOKOT-S) 8.6-50 MG tablet Take 2 tablets by mouth daily. 05/03/18   Teryl Lucy, MD  Testosterone 20.25 MG/ACT (1.62%) GEL Apply 1 Pump topically daily. 04/04/18   [provider]  traZODone (DESYREL) 100 MG tablet Take 100 mg by mouth at bedtime as needed for sleep.    [provider]  VASCEPA 1 g CAPS Take 2 g by mouth daily.  05/18/17   [provider]    Family  History Family History  Family history unknown: Yes    Social History Social History   Tobacco Use   Smoking status: Never   Smokeless tobacco: Never  Vaping Use   Vaping status: Never Used  Substance Use Topics   Alcohol use: No   Drug use: No     Allergies   Patient has no known allergies.   Review of Systems Review of Systems Per HPI  Physical Exam Triage Vital Signs ED Triage Vitals  Encounter Vitals Group     BP 07/26/23 1428 (!) 156/76     Systolic BP Percentile --      Diastolic BP Percentile --      Pulse Rate 07/26/23 1428 60     Resp --      Temp 07/26/23 1428 97.9 F (36.6 C)     Temp Source 07/26/23 1428 Oral     SpO2 07/26/23 1428 97 %     Weight 07/26/23 1427 200 lb (90.7 kg)     Height 07/26/23 1427 6\' 3"  (1.905 m)     Head Circumference --      Peak Flow --      Pain Score 07/26/23 1427 6     Pain Loc --      Pain Education --      Exclude from Growth Chart --    No data found.  Updated Vital Signs BP (!) 156/76 (BP Location: Right Arm)   Pulse 60   Temp 97.9 F (36.6 C) (Oral)   Ht 6\' 3"  (1.905 m)   Wt 200 lb (90.7 kg)   SpO2 97%   BMI 25.00 kg/m   Visual Acuity Right Eye Distance:   Left Eye Distance:   Bilateral Distance:    Right Eye Near:   Left Eye Near:    Bilateral Near:     Physical Exam Constitutional:      General: He is not in acute distress.    Appearance: Normal appearance. He is not toxic-appearing or diaphoretic.  HENT:     Head: Normocephalic and atraumatic.     Right Ear: Ear canal and external ear normal. No drainage, swelling or tenderness. No middle ear effusion. There is no impacted cerumen. No foreign body. No mastoid tenderness. A PE tube is present. Tympanic membrane is erythematous. Tympanic membrane is not perforated or bulging.  Eyes:     Extraocular Movements: Extraocular movements intact.     Conjunctiva/sclera: Conjunctivae normal.  Pulmonary:     Effort: Pulmonary effort is normal.   Neurological:     General: No focal deficit present.     Mental Status: He is alert and oriented to person, place, and time.  Mental status is at baseline.  Psychiatric:        Mood and Affect: Mood normal.        Behavior: Behavior normal.        Thought Content: Thought content normal.        Judgment: Judgment normal.      UC Treatments / Results  Labs (all labs ordered are listed, but only abnormal results are displayed) Labs Reviewed - No data to display  EKG   Radiology No results found.  Procedures Procedures (including critical care time)  Medications Ordered in UC Medications - No data to display  Initial Impression / Assessment and Plan / UC Course  I have reviewed the triage vital signs and the nursing notes.  Pertinent labs & imaging results that were available during my care of the patient were reviewed by me and considered in my medical decision making (see chart for details).     Patient has right otitis media with tube in place.  Will treat with Ciprodex eardrops and Augmentin antibiotic.  Advised strict follow-up with his established ENT and/or PCP if symptoms persist or worsen.  Patient verbalized understanding and was agreeable with plan. Final Clinical Impressions(s) / UC Diagnoses   Final diagnoses:  Other non-recurrent acute nonsuppurative otitis media of right ear     Discharge Instructions      I have prescribed you antibiotic ear drops and an antibiotic that you will take by mouth for ear infection of the right ear.  Please follow-up with primary care doctor or ear, nose, throat specialist if symptoms persist or worsen.    ED Prescriptions     Medication Sig Dispense Auth. Provider   ciprofloxacin-dexamethasone (CIPRODEX) OTIC suspension Place 4 drops into the right ear 2 (two) times daily for 7 days. 7.5 mL Ervin Knack E, [place]   amoxicillin-clavulanate (AUGMENTIN) 875-125 MG tablet Take 1 tablet by mouth every 12 (twelve) hours. 14  tablet [place], Acie Fredrickson, [place]      PDMP not reviewed this encounter.   Gustavus Bryant, [place] 07/26/23 1450

## 2023-07-26 NOTE — ED Triage Notes (Signed)
Patient presents with right ear pain x day 5. States it has gotten worse with time.

## 2023-07-26 NOTE — Discharge Instructions (Signed)
I have prescribed you antibiotic ear drops and an antibiotic that you will take by mouth for ear infection of the right ear.  Please follow-up with primary care doctor or ear, nose, throat specialist if symptoms persist or worsen.

## 2024-10-31 ENCOUNTER — Ambulatory Visit (INDEPENDENT_AMBULATORY_CARE_PROVIDER_SITE_OTHER): Admitting: Otolaryngology

## 2024-10-31 ENCOUNTER — Encounter (INDEPENDENT_AMBULATORY_CARE_PROVIDER_SITE_OTHER): Payer: Self-pay | Admitting: Otolaryngology

## 2024-10-31 VITALS — BP 162/80 | HR 80 | Temp 97.9°F | Ht 75.0 in | Wt 210.0 lb

## 2024-10-31 DIAGNOSIS — H938X1 Other specified disorders of right ear: Secondary | ICD-10-CM

## 2024-10-31 DIAGNOSIS — H6991 Unspecified Eustachian tube disorder, right ear: Secondary | ICD-10-CM

## 2024-10-31 DIAGNOSIS — Z9622 Myringotomy tube(s) status: Secondary | ICD-10-CM

## 2024-10-31 MED ORDER — OFLOXACIN 0.3 % OT SOLN: 4.0000 [drp] | Freq: Two times a day (BID) | OTIC | 1 refills | Status: AC

## 2024-10-31 NOTE — Patient Instructions (Signed)
 Put ofloxacin  ear drops 4 drops twice daily for 2 weeks in right ear

## 2024-11-29 ENCOUNTER — Ambulatory Visit (INDEPENDENT_AMBULATORY_CARE_PROVIDER_SITE_OTHER)

## 2024-11-30 ENCOUNTER — Ambulatory Visit (INDEPENDENT_AMBULATORY_CARE_PROVIDER_SITE_OTHER): Admitting: Otolaryngology
